# Patient Record
Sex: Male | Born: 1950 | Race: White | Hispanic: No | Smoking: Former smoker
Health system: Southern US, Community
[De-identification: ages and names within clinical notes are randomized; demographics above are authoritative.]

## PROBLEM LIST (undated history)

## (undated) DIAGNOSIS — E119 Type 2 diabetes mellitus without complications: Secondary | ICD-10-CM

## (undated) DIAGNOSIS — J449 Chronic obstructive pulmonary disease, unspecified: Secondary | ICD-10-CM

## (undated) DIAGNOSIS — I10 Essential (primary) hypertension: Secondary | ICD-10-CM

## (undated) HISTORY — PX: LUMBAR DISC SURGERY: SHX700

## (undated) HISTORY — PX: ROTATOR CUFF REPAIR: SHX139

---

## 2016-02-20 ENCOUNTER — Ambulatory Visit: Admission: RE | Admit: 2016-02-20 | Payer: Self-pay | Source: Ambulatory Visit | Admitting: Ophthalmology

## 2016-02-20 ENCOUNTER — Encounter: Admission: RE | Payer: Self-pay | Source: Ambulatory Visit

## 2016-02-20 SURGERY — PHACOEMULSIFICATION, CATARACT, WITH IOL INSERTION
Anesthesia: Choice | Laterality: Left

## 2018-09-22 ENCOUNTER — Other Ambulatory Visit
Admission: RE | Admit: 2018-09-22 | Discharge: 2018-09-22 | Disposition: A | Discharge status: Home / Self Care | Payer: Medicare Other | Source: Ambulatory Visit | Attending: Rheumatology | Admitting: Rheumatology

## 2018-09-22 DIAGNOSIS — M1990 Unspecified osteoarthritis, unspecified site: Secondary | ICD-10-CM | POA: Insufficient documentation

## 2018-09-22 DIAGNOSIS — Z8739 Personal history of other diseases of the musculoskeletal system and connective tissue: Secondary | ICD-10-CM | POA: Insufficient documentation

## 2018-09-22 DIAGNOSIS — M25562 Pain in left knee: Secondary | ICD-10-CM | POA: Diagnosis present

## 2018-09-22 LAB — SYNOVIAL CELL COUNT + DIFF, W/ CRYSTALS
Eosinophils-Synovial: 0 %
Lymphocytes-Synovial Fld: 2 %
Monocyte-Macrophage-Synovial Fluid: 23 %
NEUTROPHIL, SYNOVIAL: 75 %
WBC, SYNOVIAL: 5462 /mm3 — AB (ref 0–200)

## 2019-02-06 ENCOUNTER — Other Ambulatory Visit (HOSPITAL_COMMUNITY): Payer: Self-pay

## 2019-02-13 ENCOUNTER — Ambulatory Visit: Admit: 2019-02-13 | Payer: Self-pay | Admitting: Ophthalmology

## 2019-02-13 SURGERY — PHACOEMULSIFICATION, CATARACT, WITH IOL INSERTION
Anesthesia: Monitor Anesthesia Care | Laterality: Left

## 2019-02-21 ENCOUNTER — Other Ambulatory Visit (HOSPITAL_COMMUNITY): Payer: Self-pay

## 2019-02-27 ENCOUNTER — Ambulatory Visit: Admit: 2019-02-27 | Payer: Self-pay | Admitting: Ophthalmology

## 2019-02-27 SURGERY — PHACOEMULSIFICATION, CATARACT, WITH IOL INSERTION
Anesthesia: Monitor Anesthesia Care | Laterality: Right

## 2019-04-03 ENCOUNTER — Other Ambulatory Visit: Payer: Self-pay

## 2019-04-03 ENCOUNTER — Encounter (HOSPITAL_COMMUNITY)
Admission: RE | Admit: 2019-04-03 | Discharge: 2019-04-03 | Disposition: A | Discharge status: Home / Self Care | Payer: Medicare Other | Source: Ambulatory Visit | Attending: Ophthalmology | Admitting: Ophthalmology

## 2019-04-03 ENCOUNTER — Encounter (HOSPITAL_COMMUNITY): Payer: Self-pay

## 2019-04-03 DIAGNOSIS — Z1159 Encounter for screening for other viral diseases: Secondary | ICD-10-CM | POA: Insufficient documentation

## 2019-04-03 DIAGNOSIS — Z01812 Encounter for preprocedural laboratory examination: Secondary | ICD-10-CM | POA: Diagnosis not present

## 2019-04-03 HISTORY — DX: Type 2 diabetes mellitus without complications: E11.9

## 2019-04-03 HISTORY — DX: Essential (primary) hypertension: I10

## 2019-04-03 HISTORY — DX: Chronic obstructive pulmonary disease, unspecified: J44.9

## 2019-04-03 LAB — BASIC METABOLIC PANEL
Anion gap: 12 (ref 5–15)
BUN: 18 mg/dL (ref 8–23)
CO2: 22 mmol/L (ref 22–32)
Calcium: 9.1 mg/dL (ref 8.9–10.3)
Chloride: 106 mmol/L (ref 98–111)
Creatinine, Ser: 1.24 mg/dL (ref 0.61–1.24)
GFR calc Af Amer: >60 mL/min (ref >60)
GFR calc non Af Amer: >60 mL/min (ref >60)
Glucose, Bld: 105 mg/dL — ABNORMAL HIGH (ref 70–99)
Potassium: 4.2 mmol/L (ref 3.5–5.1)
Sodium: 140 mmol/L (ref 135–145)

## 2019-04-03 LAB — HEMOGLOBIN A1C
Hgb A1c MFr Bld: 5.7 % — ABNORMAL HIGH (ref 4.8–5.6)
Mean Plasma Glucose: 116.89 mg/dL

## 2019-04-03 LAB — GLUCOSE, CAPILLARY: Glucose-Capillary: 98 mg/dL (ref 70–99)

## 2019-04-03 NOTE — Patient Instructions (Signed)
Jonathan Wheeler  04/03/2019     @PREFPERIOPPHARMACY @   Your procedure is scheduled on  04/10/2019.  [facility] at  [facility]   [facility].M.  Call this number if you have problems the morning of surgery:  [phone]   Remember:  Do not eat or drink after midnight.                        Take these medicines the morning of surgery with A SIP OF WATER  Colchicine, gabapentin, tramadol(if needed). Use your inhaler before you come. Take 1/2 of your night time insulin the night before your surgery.    Do not wear jewelry, make-up or nail polish.  Do not wear lotions, powders, or perfumes, or deodorant.  Do not shave 48 hours prior to surgery.  Men may shave face and neck.  Do not bring valuables to the hospital.  [facility] any belongings or valuables.  Contacts, dentures or bridgework may not be worn into surgery.  Leave your suitcase in the car.  After surgery it may be brought to your room.  For patients admitted to the hospital, discharge time will be determined by your treatment team.  Patients discharged the day of surgery will not be allowed to drive home.   Name and phone number of your driver:   family Special instructions:  None  Please read over the following fact sheets that you were given. Anesthesia Post-op Instructions and Care and Recovery After Surgery      Cataract Surgery, Care After This sheet gives you information about how to care for yourself after your procedure. Your health care provider may also give you more specific instructions. If you have problems or questions, contact your health care provider. What can I expect after the procedure? After the procedure, it is common to have:  Itching.  Discomfort.  Fluid discharge.  Sensitivity to light and to touch.  Bruising in or around the eye.  Mild blurred vision. Follow these instructions at home: Eye care   Do not touch or rub your eyes.  Protect your eyes as told by  your health care provider. You may be told to wear a protective eye shield or sunglasses.  Do not put a contact lens into the affected eye or eyes until your health care provider approves.  Keep the area around your eye clean and dry: ? Avoid swimming. ? Do not allow water to hit you directly in the face while showering. ? Keep soap and shampoo out of your eyes.  Check your eye every day for signs of infection. Watch for: ? Redness, swelling, or pain. ? Fluid, blood, or pus. ? Warmth. ? A bad smell. ? Vision that is getting worse. ? Sensitivity that is getting worse. Activity  Do not drive for 24 hours if you were given a sedative during your procedure.  Avoid strenuous activities, such as playing contact sports, for as long as told by your health care provider.  Do not drive or use heavy machinery until your health care provider approves.  Do not bend or lift heavy objects. Bending increases pressure in the eye. You can walk, climb stairs, and do light household chores.  Ask your health care provider when you can return to work. If you work in a dusty environment, you may be advised to wear protective eyewear for a period of time. General instructions  Take or apply over-the-counter  and prescription medicines only as told by your health care provider. This includes eye drops.  Keep all follow-up visits as told by your health care provider. This is important. Contact a health care provider if:  You have increased bruising around your eye.  You have pain that is not helped with medicine.  You have a fever.  You have redness, swelling, or pain in your eye.  You have fluid, blood, or pus coming from your incision.  Your vision gets worse.  Your sensitivity to light gets worse. Get help right away if:  You have sudden loss of vision.  You see flashes of light or spots (floaters).  You have severe eye pain.  You develop nausea or vomiting. Summary  After your  procedure, it is common to have itching, discomfort, bruising, fluid discharge, or sensitivity to light.  Follow instructions from your health care provider about caring for your eye after the procedure.  Do not rub your eye after the procedure. You may need to wear eye protection or sunglasses. Do not wear contact lenses. Keep the area around your eye clean and dry.  Avoid activities that require a lot of effort. These include playing sports and lifting heavy objects.  Contact a health care provider if you have increased bruising, pain that does not go away, or a fever. Get help right away if you suddenly lose your vision, see flashes of light or spots, or have severe pain in the eye. This information is not intended to replace advice given to you by your health care provider. Make sure you discuss any questions you have with your health care provider. Document Released: 05/08/2005 Document Revised: 04/18/2018 Document Reviewed: 04/18/2018 Elsevier Interactive Patient Education  2019 [employer].

## 2019-04-05 LAB — NOVEL CORONAVIRUS, NAA (HOSP ORDER, SEND-OUT TO REF LAB; TAT 18-24 HRS)

## 2019-04-06 ENCOUNTER — Other Ambulatory Visit (HOSPITAL_COMMUNITY)
Admission: RE | Admit: 2019-04-06 | Discharge: 2019-04-06 | Disposition: A | Discharge status: Home / Self Care | Payer: Medicare Other | Source: Ambulatory Visit | Attending: Ophthalmology | Admitting: Ophthalmology

## 2019-04-06 ENCOUNTER — Other Ambulatory Visit: Payer: Self-pay

## 2019-04-06 DIAGNOSIS — Z01812 Encounter for preprocedural laboratory examination: Secondary | ICD-10-CM | POA: Diagnosis not present

## 2019-04-07 LAB — NOVEL CORONAVIRUS, NAA (HOSP ORDER, SEND-OUT TO REF LAB; TAT 18-24 HRS): SARS-CoV-2, NAA: NOT DETECTED

## 2019-04-10 ENCOUNTER — Ambulatory Visit (HOSPITAL_COMMUNITY)
Admission: RE | Admit: 2019-04-10 | Discharge: 2019-04-10 | Disposition: A | Discharge status: Home / Self Care | Payer: Medicare Other | Attending: Ophthalmology | Admitting: Ophthalmology

## 2019-04-10 ENCOUNTER — Ambulatory Visit (HOSPITAL_COMMUNITY): Payer: Medicare Other | Admitting: Anesthesiology

## 2019-04-10 ENCOUNTER — Other Ambulatory Visit: Payer: Self-pay

## 2019-04-10 ENCOUNTER — Encounter (HOSPITAL_COMMUNITY): Payer: Self-pay | Admitting: *Deleted

## 2019-04-10 ENCOUNTER — Encounter (HOSPITAL_COMMUNITY)
Admission: RE | Disposition: A | Discharge status: Home / Self Care | Payer: Self-pay | Source: Home / Self Care | Attending: Ophthalmology

## 2019-04-10 DIAGNOSIS — E1136 Type 2 diabetes mellitus with diabetic cataract: Secondary | ICD-10-CM | POA: Insufficient documentation

## 2019-04-10 DIAGNOSIS — J449 Chronic obstructive pulmonary disease, unspecified: Secondary | ICD-10-CM | POA: Insufficient documentation

## 2019-04-10 DIAGNOSIS — Z794 Long term (current) use of insulin: Secondary | ICD-10-CM | POA: Diagnosis not present

## 2019-04-10 DIAGNOSIS — E1122 Type 2 diabetes mellitus with diabetic chronic kidney disease: Secondary | ICD-10-CM | POA: Diagnosis not present

## 2019-04-10 DIAGNOSIS — M1990 Unspecified osteoarthritis, unspecified site: Secondary | ICD-10-CM | POA: Diagnosis not present

## 2019-04-10 DIAGNOSIS — I129 Hypertensive chronic kidney disease with stage 1 through stage 4 chronic kidney disease, or unspecified chronic kidney disease: Secondary | ICD-10-CM | POA: Insufficient documentation

## 2019-04-10 DIAGNOSIS — N189 Chronic kidney disease, unspecified: Secondary | ICD-10-CM | POA: Diagnosis not present

## 2019-04-10 DIAGNOSIS — H259 Unspecified age-related cataract: Secondary | ICD-10-CM | POA: Diagnosis not present

## 2019-04-10 DIAGNOSIS — Z87891 Personal history of nicotine dependence: Secondary | ICD-10-CM | POA: Diagnosis not present

## 2019-04-10 DIAGNOSIS — Z7982 Long term (current) use of aspirin: Secondary | ICD-10-CM | POA: Diagnosis not present

## 2019-04-10 DIAGNOSIS — Z79899 Other long term (current) drug therapy: Secondary | ICD-10-CM | POA: Diagnosis not present

## 2019-04-10 HISTORY — PX: CATARACT EXTRACTION W/PHACO: SHX586

## 2019-04-10 LAB — GLUCOSE, CAPILLARY: Glucose-Capillary: 99 mg/dL (ref 70–99)

## 2019-04-10 SURGERY — PHACOEMULSIFICATION, CATARACT, WITH IOL INSERTION
Anesthesia: Monitor Anesthesia Care | Site: Eye | Laterality: Left

## 2019-04-10 MED ORDER — PROVISC 10 MG/ML IO SOLN
INTRAOCULAR | Status: DC | PRN
  Administered 2019-04-10: 0.85 mL via INTRAOCULAR

## 2019-04-10 MED ORDER — SODIUM HYALURONATE 23 MG/ML IO SOLN
INTRAOCULAR | Status: DC | PRN
  Administered 2019-04-10: 0.6 mL via INTRAOCULAR

## 2019-04-10 MED ORDER — CYCLOPENTOLATE-PHENYLEPHRINE 0.2-1 % OP SOLN
1.0000 [drp] | OPHTHALMIC | Status: AC
  Administered 2019-04-10 (×3): 1 [drp] via OPHTHALMIC

## 2019-04-10 MED ORDER — TETRACAINE HCL 0.5 % OP SOLN
1.0000 [drp] | OPHTHALMIC | Status: AC
  Administered 2019-04-10 (×3): 1 [drp] via OPHTHALMIC

## 2019-04-10 MED ORDER — BSS IO SOLN
INTRAOCULAR | Status: DC | PRN
  Administered 2019-04-10: 15 mL

## 2019-04-10 MED ORDER — LACTATED RINGERS IV SOLN: INTRAVENOUS | Status: DC

## 2019-04-10 MED ORDER — POVIDONE-IODINE 5 % OP SOLN
OPHTHALMIC | Status: DC | PRN
  Administered 2019-04-10: 1 via OPHTHALMIC

## 2019-04-10 MED ORDER — PHENYLEPHRINE HCL 2.5 % OP SOLN
1.0000 [drp] | OPHTHALMIC | Status: AC
  Administered 2019-04-10 (×3): 1 [drp] via OPHTHALMIC

## 2019-04-10 MED ORDER — LIDOCAINE HCL 3.5 % OP GEL
1.0000 "application " | Freq: Once | OPHTHALMIC | Status: AC
  Administered 2019-04-10: 1 via OPHTHALMIC

## 2019-04-10 MED ORDER — LIDOCAINE HCL (PF) 1 % IJ SOLN
INTRAOCULAR | Status: DC | PRN
  Administered 2019-04-10: 1 mL via OPHTHALMIC

## 2019-04-10 MED ORDER — NEOMYCIN-POLYMYXIN-DEXAMETH 3.5-10000-0.1 OP SUSP
OPHTHALMIC | Status: DC | PRN
  Administered 2019-04-10: 2 [drp] via OPHTHALMIC

## 2019-04-10 MED ORDER — EPINEPHRINE PF 1 MG/ML IJ SOLN
INTRAOCULAR | Status: DC | PRN
  Administered 2019-04-10: 500 mL

## 2019-04-10 SURGICAL SUPPLY — 14 items
CLOTH BEACON ORANGE TIMEOUT ST (SAFETY) ×3 IMPLANT
GLOVE BIOGEL PI IND STRL 6.5 (GLOVE) ×1 IMPLANT
GLOVE BIOGEL PI IND STRL 7.0 (GLOVE) ×1 IMPLANT
GLOVE BIOGEL PI INDICATOR 6.5 (GLOVE) ×2
GLOVE BIOGEL PI INDICATOR 7.0 (GLOVE) ×2
LENS ALC ACRYL/TECN (Ophthalmic Related) ×3 IMPLANT
NEEDLE HYPO 18GX1.5 BLUNT FILL (NEEDLE) ×3 IMPLANT
PAD ARMBOARD 7.5X6 YLW CONV (MISCELLANEOUS) ×3 IMPLANT
SHIELD EYE LENSE ONLY DISP (GAUZE/BANDAGES/DRESSINGS) ×3 IMPLANT
SYR TB 1ML LL NO SAFETY (SYRINGE) ×3 IMPLANT
TAPE SURG TRANSPORE 1 IN (GAUZE/BANDAGES/DRESSINGS) ×1 IMPLANT
TAPE SURGICAL TRANSPORE 1 IN (GAUZE/BANDAGES/DRESSINGS) ×2
VISCOELASTIC ADDITIONAL (OPHTHALMIC RELATED) ×3 IMPLANT
WATER STERILE IRR 250ML POUR (IV SOLUTION) ×3 IMPLANT

## 2019-04-10 NOTE — H&P (Signed)
The H and P was reviewed and updated. The patient was examined.  No changes were found after exam.  The surgical eye was marked.  

## 2019-04-10 NOTE — Anesthesia Postprocedure Evaluation (Signed)
Anesthesia Post Note  Patient: Jonathan Wheeler  Procedure(s) Performed: CATARACT EXTRACTION PHACO AND INTRAOCULAR LENS PLACEMENT ([place]) (Left Eye)  Patient location during evaluation: PACU Anesthesia Type: MAC Level of consciousness: awake and alert and oriented Pain management: pain level controlled Vital Signs Assessment: post-procedure vital signs reviewed and stable Respiratory status: spontaneous breathing Cardiovascular status: blood pressure returned to baseline and stable Postop Assessment: no apparent nausea or vomiting Anesthetic complications: no     Last Vitals:  Vitals:   04/10/19 0846  BP: 128/70  Resp: 20  Temp: 36.7 C  SpO2: 94%    Last Pain:  Vitals:   04/10/19 0846  TempSrc: Oral  PainSc: 0-No pain                 Kenzee Bassin

## 2019-04-10 NOTE — Discharge Instructions (Addendum)
Please discharge patient when stable, will follow up today with Dr. Wrzosek at the [facility] office immediately following discharge.  Leave shield in place until visit.  All paperwork with discharge instructions will be given at the office. ° °

## 2019-04-10 NOTE — Transfer of Care (Signed)
Immediate Anesthesia Transfer of Care Note  Patient: Jonathan Wheeler  Procedure(s) Performed: CATARACT EXTRACTION PHACO AND INTRAOCULAR LENS PLACEMENT ([facility]) (Left Eye)  Patient Location: PACU  Anesthesia Type:MAC  Level of Consciousness: awake, alert  and oriented  Airway & Oxygen Therapy: Patient Spontanous Breathing  Post-op Assessment: Post -op Vital signs reviewed and stable  Post vital signs: Reviewed and stable  Last Vitals:  Vitals Value Taken Time  BP    Temp    Pulse    Resp    SpO2      Last Pain:  Vitals:   04/10/19 0846  TempSrc: Oral  PainSc: 0-No pain      Patients Stated Pain Goal: 8 (85/88/50 2774)  Complications: No apparent anesthesia complications

## 2019-04-10 NOTE — Op Note (Signed)
Date of procedure: 04/10/19  Pre-operative diagnosis: Visually significant age-related cataract, Left Eye (H25.812)  Post-operative diagnosis: Visually significant age-related cataract, Left Eye  Procedure: Removal of cataract via phacoemulsification and insertion of intra-ocular lens [employer]  +23.0D into the capsular bag of the Left Eye  Attending surgeon: Gerda Diss. Laray Rivkin, MD, MA  Anesthesia: MAC, Topical Akten  Complications: None  Estimated Blood Loss: <25m (minimal)  Specimens: None  Implants: As above  Indications:  Visually significant age-related cataract, Left Eye  Procedure:  The patient was seen and identified in the pre-operative area. The operative eye was identified and dilated.  The operative eye was marked.  Topical anesthesia was administered to the operative eye.     The patient was then to the operative suite and placed in the supine position.  A timeout was performed confirming the patient, procedure to be performed, and all other relevant information.   The patient's face was prepped and draped in the usual fashion for intra-ocular surgery.  A lid speculum was placed into the operative eye and the surgical microscope moved into place and focused.  An inferotemporal paracentesis was created using a 20 gauge paracentesis blade.  Shugarcaine was injected into the anterior chamber.  Viscoelastic was injected into the anterior chamber.  A temporal clear-corneal main wound incision was created using a 2.471mmicrokeratome.  A continuous curvilinear capsulorrhexis was initiated using an irrigating cystitome and completed using capsulorrhexis forceps.  Hydrodissection and hydrodeliniation were performed.  Viscoelastic was injected into the anterior chamber.  A phacoemulsification handpiece and a chopper as a second instrument were used to remove the nucleus and epinucleus. The irrigation/aspiration handpiece was used to remove any remaining cortical  material.   The capsular bag was reinflated with viscoelastic, checked, and found to be intact.  The intraocular lens was inserted into the capsular bag and dialed into place using a Kuglen hook.  The irrigation/aspiration handpiece was used to remove any remaining viscoelastic.  The clear corneal wound and paracentesis wounds were then hydrated and checked with Weck-Cels to be watertight.  The lid-speculum and drape was removed, and the patient's face was cleaned with a wet and dry 4x4.  Maxitrol was instilled in the eye before a clear shield was taped over the eye. The patient was taken to the post-operative care unit in good condition, having tolerated the procedure well.  Post-Op Instructions: [facility] and will receive all other orders and instructions.

## 2019-04-10 NOTE — Anesthesia Preprocedure Evaluation (Signed)
Anesthesia Evaluation  Patient identified by MRN, date of birth, ID band Patient awake    Reviewed: Allergy & Precautions, NPO status , Patient's Chart, lab work & pertinent test results, reviewed documented beta blocker date and time   Airway Mallampati: I  TM Distance: >3 FB Neck ROM: Full    Dental no notable dental hx. (+) Missing, Poor Dentition, Chipped   Pulmonary COPD,  COPD inhaler, former smoker,  COPD denies OSA but never formally tested -BMI>40   Pulmonary exam normal breath sounds clear to auscultation       Cardiovascular Exercise Tolerance: Poor hypertension, Pt. on medications and Pt. on home beta blockers negative cardio ROS Normal cardiovascular exam Rhythm:Regular Rate:Normal  States ET limited by LBP/RLE issues  Denies CP/DOE   Neuro/Psych negative neurological ROS  negative psych ROS   GI/Hepatic negative GI ROS, Neg liver ROS,   Endo/Other  negative endocrine ROSdiabetes  Renal/GU negative Renal ROS  negative genitourinary   Musculoskeletal negative musculoskeletal ROS (+)   Abdominal   Peds negative pediatric ROS (+)  Hematology negative hematology ROS (+)   Anesthesia Other Findings   Reproductive/Obstetrics negative OB ROS                             Anesthesia Physical Anesthesia Plan  ASA: III  Anesthesia Plan: MAC   Post-op Pain Management:    Induction: Intravenous  PONV Risk Score and Plan:   Airway Management Planned: Nasal Cannula and Simple Face Mask  Additional Equipment:   Intra-op Plan:   Post-operative Plan:   Informed Consent: I have reviewed the patients History and Physical, chart, labs and discussed the procedure including the risks, benefits and alternatives for the proposed anesthesia with the patient or authorized representative who has indicated his/her understanding and acceptance.     Dental advisory given  Plan  Discussed with: CRNA  Anesthesia Plan Comments: (Plan Full PPE use  Plan MAC with GA back up -WTP)        Anesthesia Quick Evaluation

## 2019-04-11 ENCOUNTER — Encounter (HOSPITAL_COMMUNITY): Payer: Self-pay | Admitting: Ophthalmology

## 2019-04-19 ENCOUNTER — Encounter (HOSPITAL_COMMUNITY): Payer: Self-pay

## 2019-04-19 ENCOUNTER — Encounter (HOSPITAL_COMMUNITY)
Admission: RE | Admit: 2019-04-19 | Discharge: 2019-04-19 | Disposition: A | Discharge status: Home / Self Care | Payer: Medicare Other | Source: Ambulatory Visit | Attending: Ophthalmology | Admitting: Ophthalmology

## 2019-04-19 ENCOUNTER — Other Ambulatory Visit: Payer: Self-pay

## 2019-04-19 MED ORDER — HYDROCODONE-ACETAMINOPHEN 7.5-325 MG PO TABS: 1.0000 | ORAL_TABLET | Freq: Once | ORAL | Status: DC | PRN

## 2019-04-19 MED ORDER — PROMETHAZINE HCL 25 MG/ML IJ SOLN: 6.2500 mg | INTRAMUSCULAR | Status: DC | PRN

## 2019-04-19 MED ORDER — MIDAZOLAM HCL 2 MG/2ML IJ SOLN: 0.5000 mg | Freq: Once | INTRAMUSCULAR | Status: DC | PRN

## 2019-04-19 MED ORDER — HYDROMORPHONE HCL 1 MG/ML IJ SOLN: 0.2500 mg | INTRAMUSCULAR | Status: DC | PRN

## 2019-04-20 ENCOUNTER — Other Ambulatory Visit (HOSPITAL_COMMUNITY)
Admission: RE | Admit: 2019-04-20 | Discharge: 2019-04-20 | Disposition: A | Discharge status: Home / Self Care | Payer: Medicare Other | Source: Ambulatory Visit | Attending: Ophthalmology | Admitting: Ophthalmology

## 2019-04-20 DIAGNOSIS — Z1159 Encounter for screening for other viral diseases: Secondary | ICD-10-CM | POA: Insufficient documentation

## 2019-04-21 LAB — NOVEL CORONAVIRUS, NAA (HOSP ORDER, SEND-OUT TO REF LAB; TAT 18-24 HRS): SARS-CoV-2, NAA: NOT DETECTED

## 2019-04-24 ENCOUNTER — Ambulatory Visit (HOSPITAL_COMMUNITY): Payer: Medicare Other | Admitting: Anesthesiology

## 2019-04-24 ENCOUNTER — Encounter (HOSPITAL_COMMUNITY)
Admission: RE | Disposition: A | Discharge status: Home / Self Care | Payer: Self-pay | Source: Home / Self Care | Attending: Ophthalmology

## 2019-04-24 ENCOUNTER — Ambulatory Visit (HOSPITAL_COMMUNITY)
Admission: RE | Admit: 2019-04-24 | Discharge: 2019-04-24 | Disposition: A | Discharge status: Home / Self Care | Payer: Medicare Other | Attending: Ophthalmology | Admitting: Ophthalmology

## 2019-04-24 ENCOUNTER — Encounter (HOSPITAL_COMMUNITY): Payer: Self-pay | Admitting: *Deleted

## 2019-04-24 ENCOUNTER — Other Ambulatory Visit: Payer: Self-pay

## 2019-04-24 DIAGNOSIS — M1990 Unspecified osteoarthritis, unspecified site: Secondary | ICD-10-CM | POA: Diagnosis not present

## 2019-04-24 DIAGNOSIS — Z7951 Long term (current) use of inhaled steroids: Secondary | ICD-10-CM | POA: Diagnosis not present

## 2019-04-24 DIAGNOSIS — Z87891 Personal history of nicotine dependence: Secondary | ICD-10-CM | POA: Insufficient documentation

## 2019-04-24 DIAGNOSIS — H259 Unspecified age-related cataract: Secondary | ICD-10-CM | POA: Diagnosis not present

## 2019-04-24 DIAGNOSIS — E1136 Type 2 diabetes mellitus with diabetic cataract: Secondary | ICD-10-CM | POA: Insufficient documentation

## 2019-04-24 DIAGNOSIS — J449 Chronic obstructive pulmonary disease, unspecified: Secondary | ICD-10-CM | POA: Insufficient documentation

## 2019-04-24 DIAGNOSIS — E7800 Pure hypercholesterolemia, unspecified: Secondary | ICD-10-CM | POA: Diagnosis not present

## 2019-04-24 DIAGNOSIS — Z7982 Long term (current) use of aspirin: Secondary | ICD-10-CM | POA: Insufficient documentation

## 2019-04-24 DIAGNOSIS — E1122 Type 2 diabetes mellitus with diabetic chronic kidney disease: Secondary | ICD-10-CM | POA: Insufficient documentation

## 2019-04-24 DIAGNOSIS — Z9842 Cataract extraction status, left eye: Secondary | ICD-10-CM | POA: Diagnosis not present

## 2019-04-24 DIAGNOSIS — Z79899 Other long term (current) drug therapy: Secondary | ICD-10-CM | POA: Diagnosis not present

## 2019-04-24 DIAGNOSIS — I129 Hypertensive chronic kidney disease with stage 1 through stage 4 chronic kidney disease, or unspecified chronic kidney disease: Secondary | ICD-10-CM | POA: Diagnosis not present

## 2019-04-24 DIAGNOSIS — N189 Chronic kidney disease, unspecified: Secondary | ICD-10-CM | POA: Insufficient documentation

## 2019-04-24 HISTORY — PX: CATARACT EXTRACTION W/PHACO: SHX586

## 2019-04-24 LAB — GLUCOSE, CAPILLARY: Glucose-Capillary: 221 mg/dL — ABNORMAL HIGH (ref 70–99)

## 2019-04-24 SURGERY — PHACOEMULSIFICATION, CATARACT, WITH IOL INSERTION
Anesthesia: Monitor Anesthesia Care | Site: Eye | Laterality: Right

## 2019-04-24 MED ORDER — EPINEPHRINE PF 1 MG/ML IJ SOLN
INTRAMUSCULAR | Status: AC
  Filled 2019-04-24: qty 2

## 2019-04-24 MED ORDER — POVIDONE-IODINE 5 % OP SOLN
OPHTHALMIC | Status: DC | PRN
  Administered 2019-04-24: 1 via OPHTHALMIC

## 2019-04-24 MED ORDER — LIDOCAINE HCL (PF) 1 % IJ SOLN
INTRAOCULAR | Status: DC | PRN
  Administered 2019-04-24: 1 mL via OPHTHALMIC

## 2019-04-24 MED ORDER — TETRACAINE HCL 0.5 % OP SOLN
1.0000 [drp] | OPHTHALMIC | Status: AC
  Administered 2019-04-24 (×3): 1 [drp] via OPHTHALMIC

## 2019-04-24 MED ORDER — NEOMYCIN-POLYMYXIN-DEXAMETH 3.5-10000-0.1 OP SUSP
OPHTHALMIC | Status: DC | PRN
  Administered 2019-04-24: 2 [drp] via OPHTHALMIC

## 2019-04-24 MED ORDER — SODIUM HYALURONATE 23 MG/ML IO SOLN
INTRAOCULAR | Status: DC | PRN
  Administered 2019-04-24: 0.6 mL via INTRAOCULAR

## 2019-04-24 MED ORDER — CYCLOPENTOLATE-PHENYLEPHRINE 0.2-1 % OP SOLN
1.0000 [drp] | OPHTHALMIC | Status: AC
  Administered 2019-04-24 (×3): 1 [drp] via OPHTHALMIC

## 2019-04-24 MED ORDER — LIDOCAINE HCL 3.5 % OP GEL
1.0000 "application " | Freq: Once | OPHTHALMIC | Status: AC
  Administered 2019-04-24: 1 via OPHTHALMIC

## 2019-04-24 MED ORDER — PROVISC 10 MG/ML IO SOLN
INTRAOCULAR | Status: DC | PRN
  Administered 2019-04-24: 0.85 mL via INTRAOCULAR

## 2019-04-24 MED ORDER — PHENYLEPHRINE HCL 2.5 % OP SOLN
1.0000 [drp] | OPHTHALMIC | Status: AC
  Administered 2019-04-24 (×3): 1 [drp] via OPHTHALMIC

## 2019-04-24 MED ORDER — EPINEPHRINE PF 1 MG/ML IJ SOLN
INTRAOCULAR | Status: DC | PRN
  Administered 2019-04-24: 09:00:00 500 mL

## 2019-04-24 MED ORDER — BSS IO SOLN
INTRAOCULAR | Status: DC | PRN
  Administered 2019-04-24: 15 mL via INTRAOCULAR

## 2019-04-24 SURGICAL SUPPLY — 13 items

## 2019-04-24 NOTE — H&P (Signed)
The H and P was reviewed and updated. The patient was examined.  No changes were found after exam.  The surgical eye was marked.  

## 2019-04-24 NOTE — Op Note (Signed)
Date of procedure: 04/24/19  Pre-operative diagnosis: Visually significant age-related cataract, Right Eye (H25.811)  Post-operative diagnosis: Visually significant age-related cataract, Right Eye  Procedure: Removal of cataract via phacoemulsification and insertion of intra-ocular lens [employer] PCB00  +24.0D into the capsular bag of the Right Eye  Attending surgeon: Gerda Diss. Braniya Farrugia, MD, MA  Anesthesia: MAC, Topical Akten  Complications: None  Estimated Blood Loss: <85m (minimal)  Specimens: None  Implants: As above  Indications:  Visually significant age-related cataract, Right Eye  Procedure:  The patient was seen and identified in the pre-operative area. The operative eye was identified and dilated.  The operative eye was marked.  Topical anesthesia was administered to the operative eye.     The patient was then to the operative suite and placed in the supine position.  A timeout was performed confirming the patient, procedure to be performed, and all other relevant information.   The patient's face was prepped and draped in the usual fashion for intra-ocular surgery.  A lid speculum was placed into the operative eye and the surgical microscope moved into place and focused.  A superotemporal paracentesis was created using a 20 gauge paracentesis blade.  Shugarcaine was injected into the anterior chamber.  Viscoelastic was injected into the anterior chamber.  A temporal clear-corneal main wound incision was created using a 2.449mmicrokeratome.  A continuous curvilinear capsulorrhexis was initiated using an irrigating cystitome and completed using capsulorrhexis forceps.  Hydrodissection and hydrodeliniation were performed.  Viscoelastic was injected into the anterior chamber.  A phacoemulsification handpiece and a chopper as a second instrument were used to remove the nucleus and epinucleus. The irrigation/aspiration handpiece was used to remove any remaining cortical  material.   The capsular bag was reinflated with viscoelastic, checked, and found to be intact.  The intraocular lens was inserted into the capsular bag and dialed into place using a Kuglen hook.  The irrigation/aspiration handpiece was used to remove any remaining viscoelastic.  The clear corneal wound and paracentesis wounds were then hydrated and checked with Weck-Cels to be watertight.  The lid-speculum and drape was removed, and the patient's face was cleaned with a wet and dry 4x4.  Maxitrol was instilled in the eye before a clear shield was taped over the eye. The patient was taken to the post-operative care unit in good condition, having tolerated the procedure well.  Post-Op Instructions: [facility] and will receive all other orders and instructions.

## 2019-04-24 NOTE — Anesthesia Procedure Notes (Signed)
Procedure Name: MAC Date/Time: 04/24/2019 8:27 AM Performed by: Andree Elk Amy A, CRNA Pre-anesthesia Checklist: Patient identified, Emergency Drugs available, Suction available, Timeout performed and Patient being monitored Patient Re-evaluated:Patient Re-evaluated prior to induction Oxygen Delivery Method: Nasal Cannula

## 2019-04-24 NOTE — Discharge Instructions (Addendum)
PATIENT INSTRUCTIONS °POST-ANESTHESIA ° °IMMEDIATELY FOLLOWING SURGERY:  Do not drive or operate machinery for the first twenty four hours after surgery.  Do not make any important decisions for twenty four hours after surgery or while taking narcotic pain medications or sedatives.  If you develop intractable nausea and vomiting or a severe headache please notify your doctor immediately. ° °FOLLOW-UP:  Please make an appointment with your surgeon as instructed. You do not need to follow up with anesthesia unless specifically instructed to do so. ° °WOUND CARE INSTRUCTIONS (if applicable):  Keep a dry clean dressing on the anesthesia/puncture wound site if there is drainage.  Once the wound has quit draining you may leave it open to air.  Generally you should leave the bandage intact for twenty four hours unless there is drainage.  If the epidural site drains for more than 36-48 hours please call the anesthesia department. ° °QUESTIONS?:  Please feel free to call your physician or the hospital operator if you have any questions, and they will be happy to assist you.    ° ° °Please discharge patient when stable, will follow up today with Dr. Wrzosek at the [facility] office immediately following discharge.  Leave shield in place until visit.  All paperwork with discharge instructions will be given at the office. ° °

## 2019-04-24 NOTE — Transfer of Care (Signed)
Immediate Anesthesia Transfer of Care Note  Patient: Jonathan Wheeler  Procedure(s) Performed: CATARACT EXTRACTION PHACO AND INTRAOCULAR LENS PLACEMENT RIGHT EYE (Right Eye)  Patient Location: Short Stay  Anesthesia Type:MAC  Level of Consciousness: awake, alert , oriented and patient cooperative  Airway & Oxygen Therapy: Patient Spontanous Breathing  Post-op Assessment: Report given to RN and Post -op Vital signs reviewed and stable  Post vital signs: Reviewed and stable  Last Vitals:  Vitals Value Taken Time  BP    Temp    Pulse    Resp    SpO2      Last Pain:  Vitals:   04/24/19 0719  TempSrc: Oral  PainSc: 0-No pain         Complications: No apparent anesthesia complications

## 2019-04-24 NOTE — Anesthesia Postprocedure Evaluation (Signed)
Anesthesia Post Note  Patient: Jonathan Wheeler  Procedure(s) Performed: CATARACT EXTRACTION PHACO AND INTRAOCULAR LENS PLACEMENT RIGHT EYE (Right Eye)  Patient location during evaluation: Short Stay Anesthesia Type: MAC Level of consciousness: awake and alert and oriented Pain management: pain level controlled Vital Signs Assessment: post-procedure vital signs reviewed and stable Respiratory status: spontaneous breathing Cardiovascular status: stable Postop Assessment: no apparent nausea or vomiting Anesthetic complications: no     Last Vitals:  Vitals:   04/24/19 0719  BP: 135/69  Pulse: 90  Resp: 20  Temp: 37.1 C  SpO2: 94%    Last Pain:  Vitals:   04/24/19 0719  TempSrc: Oral  PainSc: 0-No pain                 Corra Kaine A

## 2019-04-24 NOTE — Anesthesia Preprocedure Evaluation (Signed)
Anesthesia Evaluation    Airway Mallampati: II       Dental  (+) Teeth Intact   Pulmonary COPD, former smoker,    breath sounds clear to auscultation       Cardiovascular hypertension,  Rhythm:regular     Neuro/Psych    GI/Hepatic   Endo/Other  diabetes, Type 2  Renal/GU      Musculoskeletal   Abdominal   Peds  Hematology   Anesthesia Other Findings FBS 221 Reports NPO since MN S/P L Phaco/IOL two weeks ago.no interval changes No issues- requests same  Reproductive/Obstetrics                             Anesthesia Physical Anesthesia Plan  ASA: III  Anesthesia Plan: MAC   Post-op Pain Management:    Induction:   PONV Risk Score and Plan:   Airway Management Planned:   Additional Equipment:   Intra-op Plan:   Post-operative Plan:   Informed Consent: I have reviewed the patients History and Physical, chart, labs and discussed the procedure including the risks, benefits and alternatives for the proposed anesthesia with the patient or authorized representative who has indicated his/her understanding and acceptance.       Plan Discussed with: Anesthesiologist  Anesthesia Plan Comments:         Anesthesia Quick Evaluation

## 2019-04-25 ENCOUNTER — Encounter (HOSPITAL_COMMUNITY): Payer: Self-pay | Admitting: Ophthalmology

## 2019-09-18 ENCOUNTER — Emergency Department: Payer: Medicare Other

## 2019-09-18 ENCOUNTER — Emergency Department
Admission: EM | Admit: 2019-09-18 | Discharge: 2019-09-18 | Disposition: A | Discharge status: Home / Self Care | Payer: Medicare Other | Attending: Emergency Medicine | Admitting: Emergency Medicine

## 2019-09-18 ENCOUNTER — Other Ambulatory Visit: Payer: Self-pay

## 2019-09-18 DIAGNOSIS — E119 Type 2 diabetes mellitus without complications: Secondary | ICD-10-CM | POA: Insufficient documentation

## 2019-09-18 DIAGNOSIS — Z794 Long term (current) use of insulin: Secondary | ICD-10-CM | POA: Diagnosis not present

## 2019-09-18 DIAGNOSIS — W19XXXA Unspecified fall, initial encounter: Secondary | ICD-10-CM

## 2019-09-18 DIAGNOSIS — Z79899 Other long term (current) drug therapy: Secondary | ICD-10-CM | POA: Diagnosis not present

## 2019-09-18 DIAGNOSIS — W010XXA Fall on same level from slipping, tripping and stumbling without subsequent striking against object, initial encounter: Secondary | ICD-10-CM | POA: Diagnosis not present

## 2019-09-18 DIAGNOSIS — M25552 Pain in left hip: Secondary | ICD-10-CM | POA: Insufficient documentation

## 2019-09-18 DIAGNOSIS — Z87891 Personal history of nicotine dependence: Secondary | ICD-10-CM | POA: Insufficient documentation

## 2019-09-18 DIAGNOSIS — J449 Chronic obstructive pulmonary disease, unspecified: Secondary | ICD-10-CM | POA: Insufficient documentation

## 2019-09-18 DIAGNOSIS — M25511 Pain in right shoulder: Secondary | ICD-10-CM | POA: Diagnosis not present

## 2019-09-18 DIAGNOSIS — I10 Essential (primary) hypertension: Secondary | ICD-10-CM | POA: Insufficient documentation

## 2019-09-18 MED ORDER — LIDOCAINE 5 % EX PTCH: 1.0000 | MEDICATED_PATCH | CUTANEOUS | 0 refills | Status: AC

## 2019-09-18 MED ORDER — BACLOFEN 10 MG PO TABS
5.0000 mg | ORAL_TABLET | Freq: Once | ORAL | Status: AC
  Administered 2019-09-18: 14:00:00 5 mg via ORAL
  Filled 2019-09-18: qty 0.5

## 2019-09-18 MED ORDER — LIDOCAINE 5 % EX PTCH
1.0000 | MEDICATED_PATCH | CUTANEOUS | Status: DC
  Administered 2019-09-18: 14:00:00 1 via TRANSDERMAL
  Filled 2019-09-18: qty 1

## 2019-09-18 MED ORDER — OXYCODONE-ACETAMINOPHEN 5-325 MG PO TABS
1.0000 | ORAL_TABLET | Freq: Once | ORAL | Status: AC
  Administered 2019-09-18: 1 via ORAL
  Filled 2019-09-18: qty 1

## 2019-09-18 MED ORDER — METHOCARBAMOL 500 MG PO TABS: 500.0000 mg | ORAL_TABLET | Freq: Three times a day (TID) | ORAL | 0 refills | Status: AC

## 2019-09-18 MED ORDER — BACLOFEN 10 MG PO TABS
5.0000 mg | ORAL_TABLET | Freq: Three times a day (TID) | ORAL | Status: DC
  Filled 2019-09-18 (×2): qty 0.5

## 2019-09-18 NOTE — ED Notes (Signed)
See triage note  Presents with left hip and right shoulder pain s/p fall this am   No deformity noted  Good pulses   Having increased pain with standing

## 2019-09-18 NOTE — ED Notes (Signed)
Patient walking with walker at this time to bathroom across hall

## 2019-09-18 NOTE — ED Provider Notes (Signed)
[facility] Emergency Department Provider Note  ____________________________________________  Time seen: Approximately 1:18 PM  I have reviewed the triage vital signs and the nursing notes.   HISTORY  Chief Complaint Fall    HPI Jonathan Wheeler is a 68 y.o. male that presents to the emergency department for evaluation of right shoulder pain and left hip pain after a fall this morning.  Patient was chasing his [place], who was chasing a cat when he fell.  He hit his right shoulder on a beam and left hip on the stairs.  He did not hit his head or lose consciousness.  He has been able to walk but with pain.  He has a cane at home.  He has chronic back pain and does not feel that he hurt his back.  No headache, neck pain, shortness of breath, chest pain, abdominal pain.  Past Medical History:  Diagnosis Date  . COPD (chronic obstructive pulmonary disease) (HCC)   . Diabetes mellitus without complication (HCC)   . Hypertension     There are no active problems to display for this patient.   Past Surgical History:  Procedure Laterality Date  . CATARACT EXTRACTION W/PHACO Left 04/10/2019   Procedure: CATARACT EXTRACTION PHACO AND INTRAOCULAR LENS PLACEMENT (IOC);  Surgeon: Fabio Pierce, MD;  Location: [facility];  Service: Ophthalmology;  Laterality: Left;  CDE: 6.16  . CATARACT EXTRACTION W/PHACO Right 04/24/2019   Procedure: CATARACT EXTRACTION PHACO AND INTRAOCULAR LENS PLACEMENT RIGHT EYE;  Surgeon: Fabio Pierce, MD;  Location: [facility];  Service: Ophthalmology;  Laterality: Right;  CDE: 12.06  . LUMBAR DISC SURGERY    . ROTATOR CUFF REPAIR      Prior to Admission medications   Medication Sig Start Date End Date Taking? Authorizing Provider  albuterol (VENTOLIN HFA) 108 (90 Base) MCG/ACT inhaler Inhale 2 puffs into the lungs every 4 (four) hours as needed for wheezing or shortness of breath.    [provider]  atorvastatin (LIPITOR) 10 MG tablet  Take 10 mg by mouth every evening.    [provider]  colchicine 0.6 MG tablet Take 0.6 mg by mouth 2 (two) times daily.    [provider]  gabapentin (NEURONTIN) 100 MG capsule Take 100 mg by mouth 3 (three) times daily.    [provider]  hydroxychloroquine (PLAQUENIL) 200 MG tablet Take 200 mg by mouth 2 (two) times a day.    [provider]  insulin glargine (LANTUS) 100 UNIT/ML injection Inject 95 Units into the skin every evening.    [provider]  insulin lispro (HUMALOG) 100 UNIT/ML injection Inject 10-15 Units into the skin See admin instructions. Inject 12 units with breakfast, 10 units with lunch, and 15 units with dinner    [provider]  levocetirizine (XYZAL) 5 MG tablet Take 5 mg by mouth every evening.    [provider]  lidocaine (LIDODERM) 5 % Place 1 patch onto the skin daily. Remove & Discard patch within 12 hours or as directed by MD 09/18/19   Enid Derry, PA-C  losartan (COZAAR) 25 MG tablet Take 25 mg by mouth every evening.    [provider]  methocarbamol (ROBAXIN) 500 MG tablet Take 1 tablet (500 mg total) by mouth 3 (three) times daily for 3 days. 09/18/19 09/21/19  Enid Derry, PA-C  montelukast (SINGULAIR) 10 MG tablet Take 10 mg by mouth at bedtime.    [provider]  Olopatadine HCl (PATADAY OP) Place 1  drop into both eyes daily.    [provider]  Polyethyl Glycol-Propyl Glycol (SYSTANE OP) Place 1 drop into both eyes 2 (two) times a day.    [provider]  sitaGLIPtin (JANUVIA) 100 MG tablet Take 100 mg by mouth daily with supper.    [provider]  Tiotropium Bromide Monohydrate (SPIRIVA RESPIMAT) 2.5 MCG/ACT AERS Inhale 2 puffs into the lungs daily.    [provider]  traMADol (ULTRAM) 50 MG tablet Take 50 mg by mouth every 6 (six) hours as needed for moderate pain.    [provider]    Allergies Patient has no known  allergies.  No family history on file.  Social History Social History   Tobacco Use  . Smoking status: Former Smoker    Packs/day: 1.00    Years: 40.00    Pack years: 40.00    Quit date: 04/02/2014    Years since quitting: 5.4  . Smokeless tobacco: Never Used  Substance Use Topics  . Alcohol use: Never    Frequency: Never  . Drug use: Never     Review of Systems  Respiratory: No SOB. Gastrointestinal: No abdominal pain.  No nausea, no vomiting.  Musculoskeletal: Positive for shoulder and hip pain. Skin: Negative for rash, abrasions, lacerations, ecchymosis. Neurological: Negative for headaches, numbness or tingling   ____________________________________________   PHYSICAL EXAM:  VITAL SIGNS: ED Triage Vitals  Enc Vitals Group     BP 09/18/19 1126 (!) 143/71     Pulse Rate 09/18/19 1126 96     Resp 09/18/19 1126 18     Temp 09/18/19 1126 98.9 F (37.2 C)     Temp Source 09/18/19 1126 Oral     SpO2 09/18/19 1126 95 %     Weight 09/18/19 1127 240 lb (108.9 kg)     Height 09/18/19 1127 5\' 6"  (1.676 m)     Head Circumference --      Peak Flow --      Pain Score 09/18/19 1127 8     Pain Loc --      Pain Edu? --      Excl. in GC? --      Constitutional: Alert and oriented. Well appearing and in no acute distress. Eyes: Conjunctivae are normal. PERRL. EOMI. Head: Atraumatic. ENT:      Ears:      Nose: No congestion/rhinnorhea.      Mouth/Throat: Mucous membranes are moist.  Neck: No stridor.  No cervical spine tenderness to palpation. Cardiovascular: Normal rate, regular rhythm.  Good peripheral circulation. Respiratory: Normal respiratory effort without tachypnea or retractions. Lungs CTAB. Good air entry to the bases with no decreased or absent breath sounds. Gastrointestinal: Bowel sounds 4 quadrants. Soft and nontender to palpation. No guarding or rigidity. No palpable masses. No distention. No  Musculoskeletal: Full range of motion to all extremities. No  gross deformities appreciated.  Limited active range of motion of right shoulder due to pain.  Full passive range of motion of right shoulder.  Tenderness to palpation just superior to trochanteric bursa.  Full range of motion of left hip.  Weightbearing. Ambulating. Neurologic:  Normal speech and language. No gross focal neurologic deficits are appreciated.  Skin:  Skin is warm, dry and intact. No rash noted. Psychiatric: Mood and affect are normal. Speech and behavior are normal. Patient exhibits appropriate insight and judgement.   ____________________________________________   LABS (all labs ordered are listed, but only abnormal results are displayed)  Labs  Reviewed - No data to display ____________________________________________  EKG   ____________________________________________  RADIOLOGY Robinette Haines, personally viewed and evaluated these images (plain radiographs) as part of my medical decision making, as well as reviewing the written report by the radiologist.  Dg Shoulder Right  Result Date: 09/18/2019 CLINICAL DATA:  Right shoulder pain after fall today. EXAM: RIGHT SHOULDER - 2+ VIEW COMPARISON:  None. FINDINGS: There is no evidence of fracture or dislocation. There is no evidence of arthropathy or other focal bone abnormality. Soft tissues are unremarkable. IMPRESSION: Negative. Electronically Signed   By: Marijo Conception M.D.   On: 09/18/2019 12:57   Dg Hip Unilat With Pelvis 2-3 Views Left  Result Date: 09/18/2019 CLINICAL DATA:  Left hip pain after fall today. EXAM: DG HIP (WITH OR WITHOUT PELVIS) 2-3V LEFT COMPARISON:  None. FINDINGS: There is no evidence of hip fracture or dislocation. There is no evidence of arthropathy or other focal bone abnormality. IMPRESSION: Negative. Electronically Signed   By: Marijo Conception M.D.   On: 09/18/2019 12:56    ____________________________________________    PROCEDURES  Procedure(s) performed:     Procedures    Medications  lidocaine (LIDODERM) 5 % 1 patch (1 patch Transdermal Patch Applied 09/18/19 1343)  oxyCODONE-acetaminophen (PERCOCET/ROXICET) 5-325 MG per tablet 1 tablet (1 tablet Oral Given 09/18/19 1344)  baclofen (LIORESAL) tablet 5 mg (5 mg Oral Given 09/18/19 1419)     ____________________________________________   INITIAL IMPRESSION / ASSESSMENT AND PLAN / ED COURSE  Pertinent labs & imaging results that were available during my care of the patient were reviewed by me and considered in my medical decision making (see chart for details).  Review of the [place] CSRS was performed in accordance of the [place] prior to dispensing any controlled drugs.   Presented to emergency department for evaluation after fall.  Vital signs and exam are reassuring.  Shoulder x-ray and hip x-ray are negative for acute bony abnormalities.  Patient was given a walker.  He also requested a prescription for a toilet seat elevator.  He ambulated to the bathroom.  Patient will be discharged home with prescriptions for Robaxin.  Patient takes tramadol for chronic pain.  Patient is to follow up with primary care as directed. Patient is given ED precautions to return to the ED for any worsening or new symptoms.  Jonathan Wheeler was evaluated in Emergency Department on 09/18/2019 for the symptoms described in the history of present illness. He was evaluated in the context of the global COVID-19 pandemic, which necessitated consideration that the patient might be at risk for infection with the SARS-CoV-2 virus that causes COVID-19. Institutional protocols and algorithms that pertain to the evaluation of patients at risk for COVID-19 are in a state of rapid change based on information released by regulatory bodies including the CDC and federal and state organizations. These policies and algorithms were followed during the patient's care in the ED.   ____________________________________________  FINAL  CLINICAL IMPRESSION(S) / ED DIAGNOSES  Final diagnoses:  Fall, initial encounter      NEW MEDICATIONS STARTED DURING THIS VISIT:  ED Discharge Orders         Ordered    Elevated toilet seat     09/18/19 1348    methocarbamol (ROBAXIN) 500 MG tablet  3 times daily     09/18/19 1427    lidocaine (LIDODERM) 5 %  Every 24 hours     09/18/19 1427  This chart was dictated using voice recognition software/Dragon. Despite best efforts to proofread, errors can occur which can change the meaning. Any change was purely unintentional.    Enid Derry, PA-C 09/18/19 1441    Emily Filbert, MD 09/18/19 [id number]

## 2019-09-18 NOTE — ED Triage Notes (Signed)
Pt states he slipped and fell this morning around 430am when he was letting his dog out and injured his left hip and right shoulder. Pt is in NAD>

## 2019-11-08 ENCOUNTER — Other Ambulatory Visit: Payer: Self-pay

## 2019-11-08 ENCOUNTER — Ambulatory Visit: Payer: Medicare Other | Attending: Internal Medicine

## 2019-11-08 DIAGNOSIS — Z20822 Contact with and (suspected) exposure to covid-19: Secondary | ICD-10-CM

## 2019-11-09 LAB — NOVEL CORONAVIRUS, NAA: SARS-CoV-2, NAA: DETECTED — AB

## 2019-11-10 ENCOUNTER — Telehealth: Payer: Self-pay | Admitting: Nurse Practitioner

## 2019-11-10 ENCOUNTER — Emergency Department (HOSPITAL_COMMUNITY): Payer: Medicare Other

## 2019-11-10 ENCOUNTER — Emergency Department (HOSPITAL_COMMUNITY)
Admission: EM | Admit: 2019-11-10 | Discharge: 2019-11-10 | Disposition: A | Discharge status: Home / Self Care | Payer: Medicare Other | Attending: Emergency Medicine | Admitting: Emergency Medicine

## 2019-11-10 ENCOUNTER — Other Ambulatory Visit: Payer: Self-pay

## 2019-11-10 ENCOUNTER — Encounter (HOSPITAL_COMMUNITY): Payer: Self-pay | Admitting: Emergency Medicine

## 2019-11-10 DIAGNOSIS — J449 Chronic obstructive pulmonary disease, unspecified: Secondary | ICD-10-CM | POA: Diagnosis not present

## 2019-11-10 DIAGNOSIS — Z79899 Other long term (current) drug therapy: Secondary | ICD-10-CM | POA: Insufficient documentation

## 2019-11-10 DIAGNOSIS — Z87891 Personal history of nicotine dependence: Secondary | ICD-10-CM | POA: Diagnosis not present

## 2019-11-10 DIAGNOSIS — I10 Essential (primary) hypertension: Secondary | ICD-10-CM | POA: Diagnosis not present

## 2019-11-10 DIAGNOSIS — R05 Cough: Secondary | ICD-10-CM | POA: Diagnosis not present

## 2019-11-10 DIAGNOSIS — U071 COVID-19: Secondary | ICD-10-CM | POA: Diagnosis present

## 2019-11-10 DIAGNOSIS — Z794 Long term (current) use of insulin: Secondary | ICD-10-CM | POA: Diagnosis not present

## 2019-11-10 DIAGNOSIS — E119 Type 2 diabetes mellitus without complications: Secondary | ICD-10-CM | POA: Insufficient documentation

## 2019-11-10 DIAGNOSIS — R509 Fever, unspecified: Secondary | ICD-10-CM | POA: Diagnosis not present

## 2019-11-10 LAB — CBC WITH DIFFERENTIAL/PLATELET
Abs Immature Granulocytes: 0.02 10*3/uL (ref 0.00–0.07)
Basophils Absolute: 0 10*3/uL (ref 0.0–0.1)
Basophils Relative: 0 %
Eosinophils Absolute: 0 10*3/uL (ref 0.0–0.5)
Eosinophils Relative: 0 %
HCT: 49.6 % (ref 39.0–52.0)
Hemoglobin: 15.3 g/dL (ref 13.0–17.0)
Immature Granulocytes: 0 %
Lymphocytes Relative: 13 %
Lymphs Abs: 0.7 10*3/uL (ref 0.7–4.0)
MCH: 31 pg (ref 26.0–34.0)
MCHC: 30.8 g/dL (ref 30.0–36.0)
MCV: 100.6 fL — ABNORMAL HIGH (ref 80.0–100.0)
Monocytes Absolute: 0.9 10*3/uL (ref 0.1–1.0)
Monocytes Relative: 16 %
Neutro Abs: 4 10*3/uL (ref 1.7–7.7)
Neutrophils Relative %: 71 %
Platelets: 166 10*3/uL (ref 150–400)
RBC: 4.93 MIL/uL (ref 4.22–5.81)
RDW: 13.2 % (ref 11.5–15.5)
WBC: 5.6 10*3/uL (ref 4.0–10.5)
nRBC: 0 % (ref 0.0–0.2)

## 2019-11-10 LAB — COMPREHENSIVE METABOLIC PANEL
ALT: 28 U/L (ref 0–44)
AST: 34 U/L (ref 15–41)
Albumin: 4.1 g/dL (ref 3.5–5.0)
Alkaline Phosphatase: 56 U/L (ref 38–126)
Anion gap: 9 (ref 5–15)
BUN: 22 mg/dL (ref 8–23)
CO2: 24 mmol/L (ref 22–32)
Calcium: 8.7 mg/dL — ABNORMAL LOW (ref 8.9–10.3)
Chloride: 105 mmol/L (ref 98–111)
Creatinine, Ser: 1.66 mg/dL — ABNORMAL HIGH (ref 0.61–1.24)
GFR calc Af Amer: 49 mL/min — ABNORMAL LOW (ref >60)
GFR calc non Af Amer: 42 mL/min — ABNORMAL LOW (ref >60)
Glucose, Bld: 106 mg/dL — ABNORMAL HIGH (ref 70–99)
Potassium: 4.8 mmol/L (ref 3.5–5.1)
Sodium: 138 mmol/L (ref 135–145)
Total Bilirubin: 0.8 mg/dL (ref 0.3–1.2)
Total Protein: 8.1 g/dL (ref 6.5–8.1)

## 2019-11-10 LAB — LACTIC ACID, PLASMA: Lactic Acid, Venous: 1.4 mmol/L (ref 0.5–1.9)

## 2019-11-10 MED ORDER — SODIUM CHLORIDE 0.9% FLUSH
3.0000 mL | Freq: Once | INTRAVENOUS | Status: AC
  Administered 2019-11-10: 3 mL via INTRAVENOUS

## 2019-11-10 NOTE — ED Provider Notes (Signed)
[facility] EMERGENCY DEPARTMENT Provider Note   CSN: [id number] Arrival date & time: 11/10/19  1736     History Chief Complaint  Patient presents with  . Covid positive    Jonathan Wheeler is a 69 y.o. male.  Pt presents to the ED today with wanting an infusion of the monoclonal antibody.  The pt has recently tested positive for Covid-19.  He had heard about the monoclonal antibody infusion and was interested in it.  Pt said he was called this morning from [facility] regarding the infusion.  He said his phone was breaking up and he could not understand what the woman on the phone was saying.  Per notes, he is considered a candidate for it, but he had declined.  Pt said he wanted to ask his pcp's opinion.  He came here because he did not know exactly what to do.  He has a cough, but it is not bad.  His pcp has prescribed prednisone and zithromax already.  Pt has a fever here, but declines any meds because he said we overcharge and he'll just wait until he gets home.         Past Medical History:  Diagnosis Date  . COPD (chronic obstructive pulmonary disease) ([place])   . Diabetes mellitus without complication ([place])   . Hypertension     There are no problems to display for this patient.   Past Surgical History:  Procedure Laterality Date  . CATARACT EXTRACTION W/PHACO Left 04/10/2019   Procedure: CATARACT EXTRACTION PHACO AND INTRAOCULAR LENS PLACEMENT (IOC);  Surgeon: Baruch Goldmann, MD;  Location: [facility];  Service: Ophthalmology;  Laterality: Left;  CDE: 6.16  . CATARACT EXTRACTION W/PHACO Right 04/24/2019   Procedure: CATARACT EXTRACTION PHACO AND INTRAOCULAR LENS PLACEMENT RIGHT EYE;  Surgeon: Baruch Goldmann, MD;  Location: [facility];  Service: Ophthalmology;  Laterality: Right;  CDE: 12.06  . LUMBAR DISC SURGERY    . ROTATOR CUFF REPAIR         Family History  Problem Relation Age of Onset  . Cancer Mother   . Heart disease Father     Social History   Tobacco Use    . Smoking status: Former Smoker    Packs/day: 1.00    Years: 40.00    Pack years: 40.00    Quit date: 04/02/2014    Years since quitting: 5.6  . Smokeless tobacco: Never Used  Substance Use Topics  . Alcohol use: Never  . Drug use: Never    Home Medications Prior to Admission medications   Medication Sig Start Date End Date Taking? Authorizing Provider  albuterol (VENTOLIN HFA) 108 (90 Base) MCG/ACT inhaler Inhale 2 puffs into the lungs every 4 (four) hours as needed for wheezing or shortness of breath.    [provider]  atorvastatin (LIPITOR) 10 MG tablet Take 10 mg by mouth every evening.    [provider]  colchicine 0.6 MG tablet Take 0.6 mg by mouth 2 (two) times daily.    [provider]  gabapentin (NEURONTIN) 100 MG capsule Take 100 mg by mouth 3 (three) times daily.    [provider]  hydroxychloroquine (PLAQUENIL) 200 MG tablet Take 200 mg by mouth 2 (two) times a day.    [provider]  insulin glargine (LANTUS) 100 UNIT/ML injection Inject 95 Units into the skin every evening.    [provider]  insulin lispro (HUMALOG) 100 UNIT/ML injection Inject 10-15 Units into the skin See admin instructions.  Inject 12 units with breakfast, 10 units with lunch, and 15 units with dinner    [provider]  levocetirizine (XYZAL) 5 MG tablet Take 5 mg by mouth every evening.    [provider]  lidocaine (LIDODERM) 5 % Place 1 patch onto the skin daily. Remove & Discard patch within 12 hours or as directed by MD 09/18/19   Enid Derry, PA-C  losartan (COZAAR) 25 MG tablet Take 25 mg by mouth every evening.    [provider]  montelukast (SINGULAIR) 10 MG tablet Take 10 mg by mouth at bedtime.    [provider]  Olopatadine HCl (PATADAY OP) Place 1 drop into both eyes daily.    [provider]  Polyethyl Glycol-Propyl Glycol (SYSTANE OP) Place 1 drop into both eyes 2 (two) times a  day.    [provider]  sitaGLIPtin (JANUVIA) 100 MG tablet Take 100 mg by mouth daily with supper.    [provider]  Tiotropium Bromide Monohydrate (SPIRIVA RESPIMAT) 2.5 MCG/ACT AERS Inhale 2 puffs into the lungs daily.    [provider]  traMADol (ULTRAM) 50 MG tablet Take 50 mg by mouth every 6 (six) hours as needed for moderate pain.    [provider]    Allergies    Patient has no known allergies.  Review of Systems   Review of Systems  Constitutional: Positive for fever.  Respiratory: Positive for cough.   All other systems reviewed and are negative.   Physical Exam Updated Vital Signs BP 114/65 (BP Location: Right Arm)   Pulse (!) 118   Temp (!) 102.4 F (39.1 C) (Oral)   Resp 16   Ht 5\' 6"  (1.676 m)   Wt 108.9 kg   SpO2 96%   BMI 38.74 kg/m   Physical Exam Vitals and nursing note reviewed.  Constitutional:      Appearance: Normal appearance.  HENT:     Head: Normocephalic and atraumatic.     Right Ear: External ear normal.     Left Ear: External ear normal.     Nose: Nose normal.     Mouth/Throat:     Mouth: Mucous membranes are moist.     Pharynx: Oropharynx is clear.  Eyes:     Extraocular Movements: Extraocular movements intact.     Conjunctiva/sclera: Conjunctivae normal.     Pupils: Pupils are equal, round, and reactive to light.  Cardiovascular:     Rate and Rhythm: Normal rate and regular rhythm.     Pulses: Normal pulses.     Heart sounds: Normal heart sounds.  Pulmonary:     Effort: Pulmonary effort is normal.     Breath sounds: Normal breath sounds.  Abdominal:     General: Abdomen is flat. Bowel sounds are normal.     Palpations: Abdomen is soft.  Musculoskeletal:        General: Normal range of motion.     Cervical back: Normal range of motion and neck supple.  Skin:    General: Skin is warm.     Capillary Refill: Capillary refill takes less than 2 seconds.  Neurological:     General: No focal  deficit present.     Mental Status: He is alert and oriented to person, place, and time.  Psychiatric:        Mood and Affect: Mood normal.        Behavior: Behavior normal.        Thought Content: Thought content  normal.        Judgment: Judgment normal.     ED Results / Procedures / Treatments   Labs (all labs ordered are listed, but only abnormal results are displayed) Labs Reviewed  COMPREHENSIVE METABOLIC PANEL - Abnormal; Notable for the following components:      Result Value   Glucose, Bld 106 (*)    Creatinine, Ser 1.66 (*)    Calcium 8.7 (*)    GFR calc non Af Amer 42 (*)    GFR calc Af Amer 49 (*)    All other components within normal limits  CBC WITH DIFFERENTIAL/PLATELET - Abnormal; Notable for the following components:   MCV 100.6 (*)    All other components within normal limits  LACTIC ACID, PLASMA  URINALYSIS, ROUTINE W REFLEX MICROSCOPIC    EKG None  Radiology DG Chest Port 1 View  Result Date: 11/10/2019 CLINICAL DATA:  Positive COVID test EXAM: PORTABLE CHEST 1 VIEW COMPARISON:  None. FINDINGS: Likely chronic mild interstitial prominence related to COPD. Minor atelectasis at the left lung base. No pleural effusion or pneumothorax. Normal heart size. IMPRESSION: Minor left basilar atelectasis. Likely chronic mild interstitial prominence related to COPD. Electronically Signed   By: Guadlupe Spanish M.D.   On: 11/10/2019 21:16    Procedures Procedures (including critical care time)  Medications Ordered in ED Medications  sodium chloride flush (NS) 0.9 % injection 3 mL (3 mLs Intravenous Given 11/10/19 2047)    ED Course  I have reviewed the triage vital signs and the nursing notes.  Pertinent labs & imaging results that were available during my care of the patient were reviewed by me and considered in my medical decision making (see chart for details).    MDM Rules/Calculators/A&P                      Pt is encouraged to call [facility] back  if he decides he does want the infusion.  Pt is febrile, but refusing antipyretics.  He is tachycardic, but that is from the fever.  I don't think he has a PE as he is 100% O2.  He looks nontoxic.  He is told to return if worse.  F/u with pulm/pcp.  Jaycub Noorani was evaluated in Emergency Department on 11/10/2019 for the symptoms described in the history of present illness. He was evaluated in the context of the global COVID-19 pandemic, which necessitated consideration that the patient might be at risk for infection with the SARS-CoV-2 virus that causes COVID-19. Institutional protocols and algorithms that pertain to the evaluation of patients at risk for COVID-19 are in a state of rapid change based on information released by regulatory bodies including the CDC and federal and state organizations. These policies and algorithms were followed during the patient's care in the ED.  Final Clinical Impression(s) / ED Diagnoses Final diagnoses:  COVID-19 virus infection    Rx / DC Orders ED Discharge Orders    None       Jacalyn Lefevre, MD 11/10/19 2142

## 2019-11-10 NOTE — Discharge Instructions (Addendum)
Alternate tylenol and ibuprofen for fever. °

## 2019-11-10 NOTE — ED Triage Notes (Signed)
Patient refuses Tylenol or Ibuprofen for fever.

## 2019-11-10 NOTE — Telephone Encounter (Signed)
Called to Discuss with patient about Covid symptoms and the use of bamlanivimab, a monoclonal antibody infusion for those with mild to moderate Covid symptoms and at a high risk of hospitalization.     Pt is qualified for this infusion at the [facility] infusion center due to co-morbid conditions and/or a member of an at-risk group.     There are no problems to display for this patient.   Patient declines infusion at this time. Symptoms tier reviewed as well as criteria for ending isolation. Preventative practices reviewed. Patient verbalized understanding.    Patient advised to call back if he decides that he does want to get infusion. Callback number to the infusion center given. Patient advised to go to Urgent care or ED with severe symptoms.    Note: symptoms started on 11/04/19 - last day for infusion would be 11/13/19.

## 2019-11-10 NOTE — ED Triage Notes (Signed)
Patient states he was sent by UC for evaluation after testing positive for Covid.

## 2020-08-13 IMAGING — CR DG HIP (WITH OR WITHOUT PELVIS) 2-3V*L*
3 series · 3 of 3 positions shown · non-contrast
Comparison: None.

CLINICAL DATA: Left hip pain after fall today.

EXAM:
DG HIP (WITH OR WITHOUT PELVIS) 2-3V LEFT

[pelvis ap]
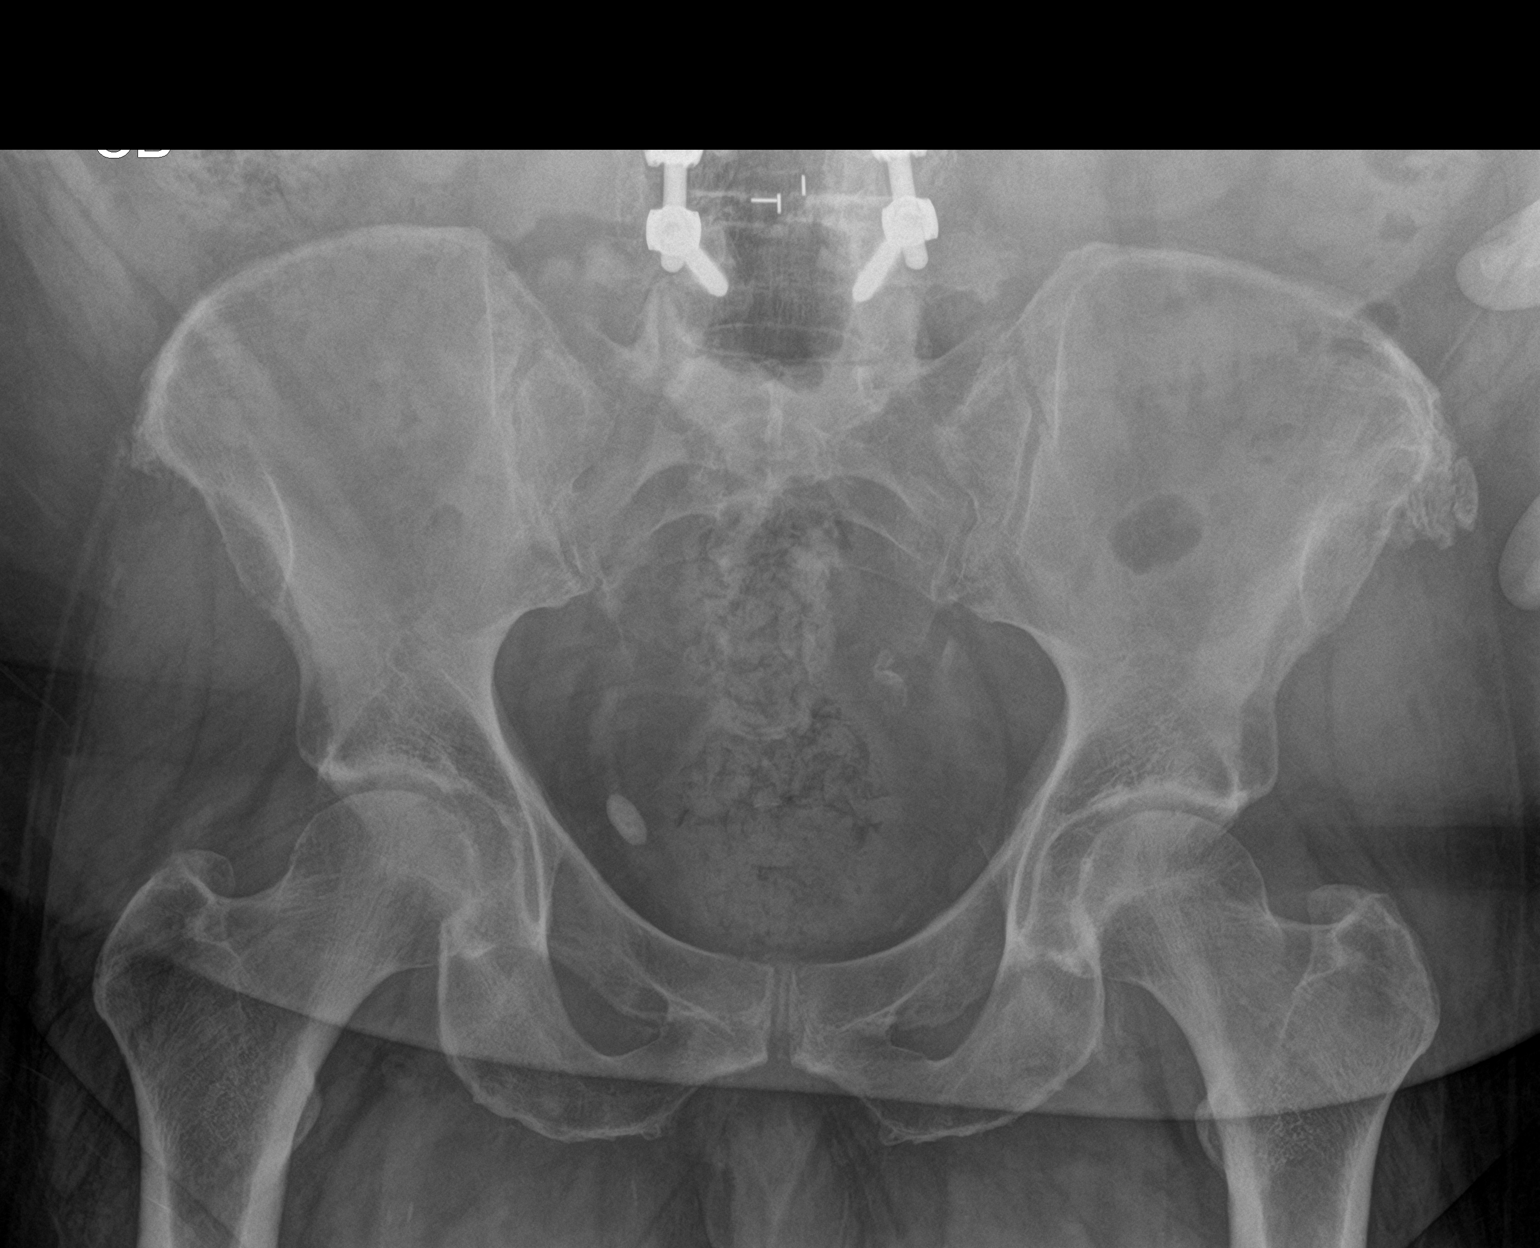

[hip ap]
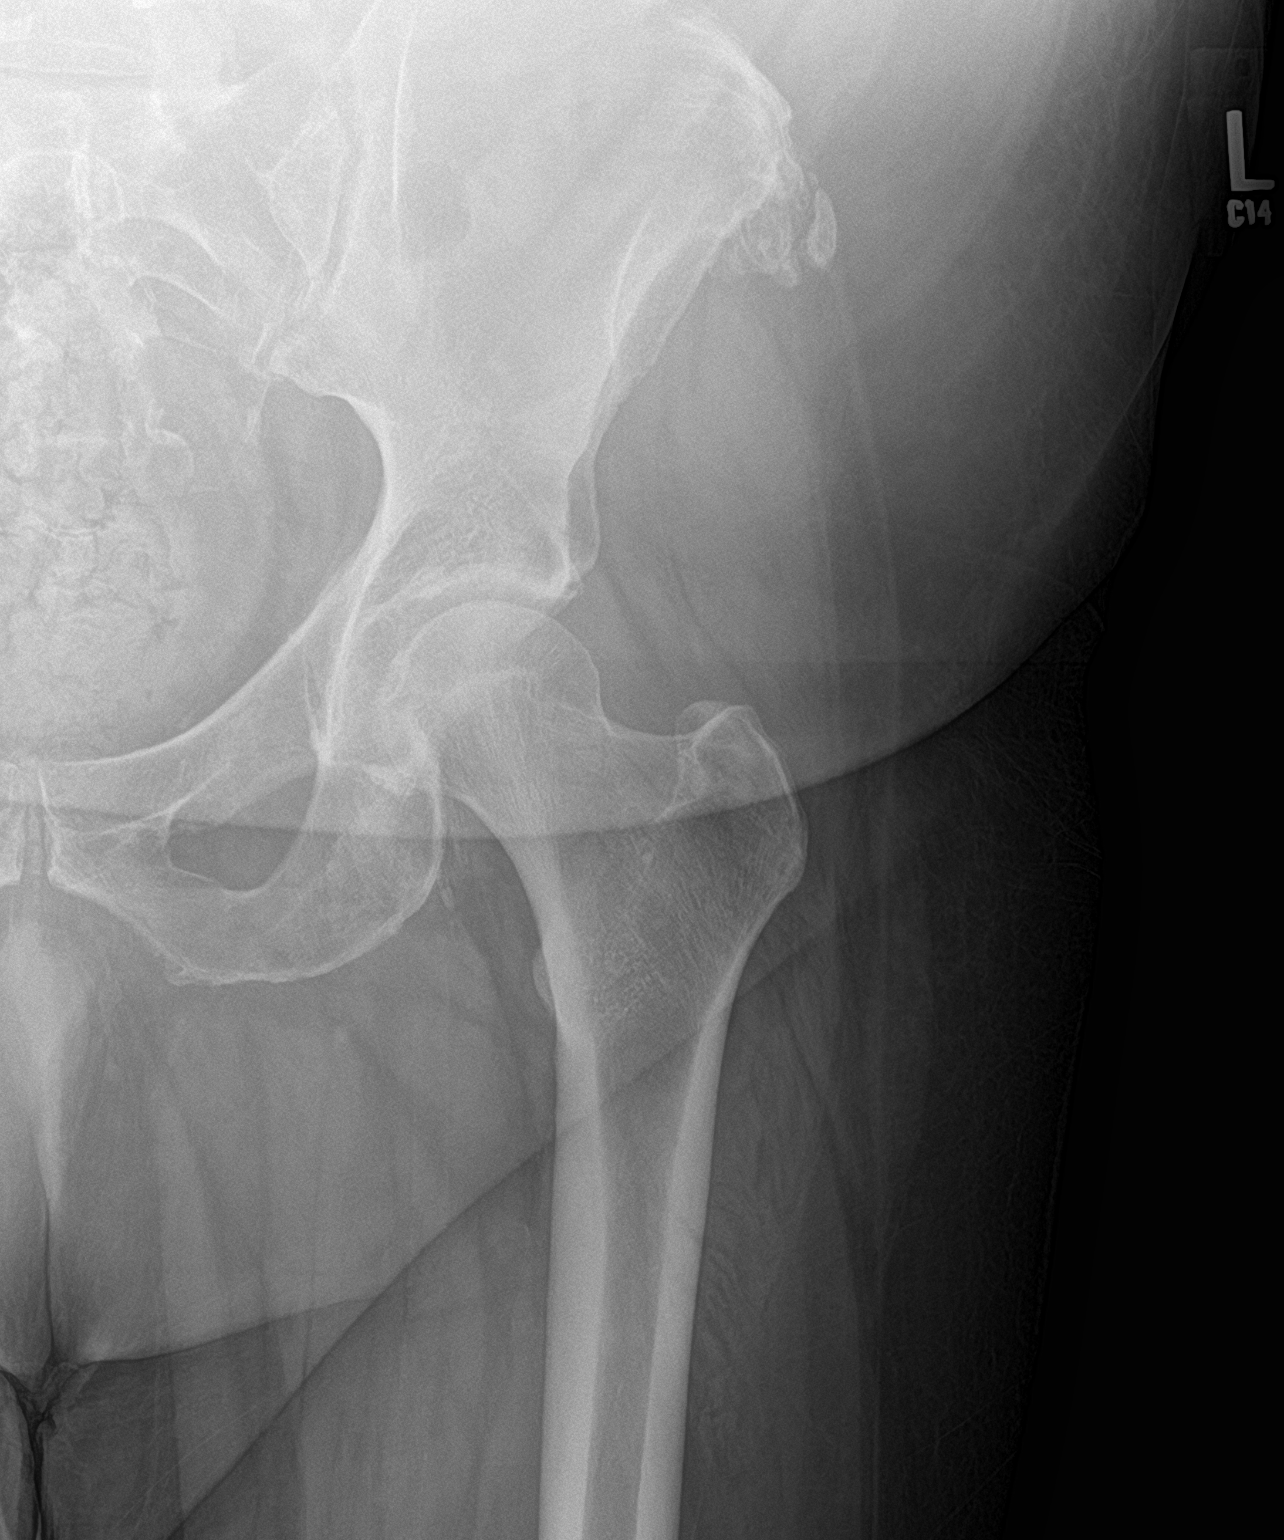

[hip lat]
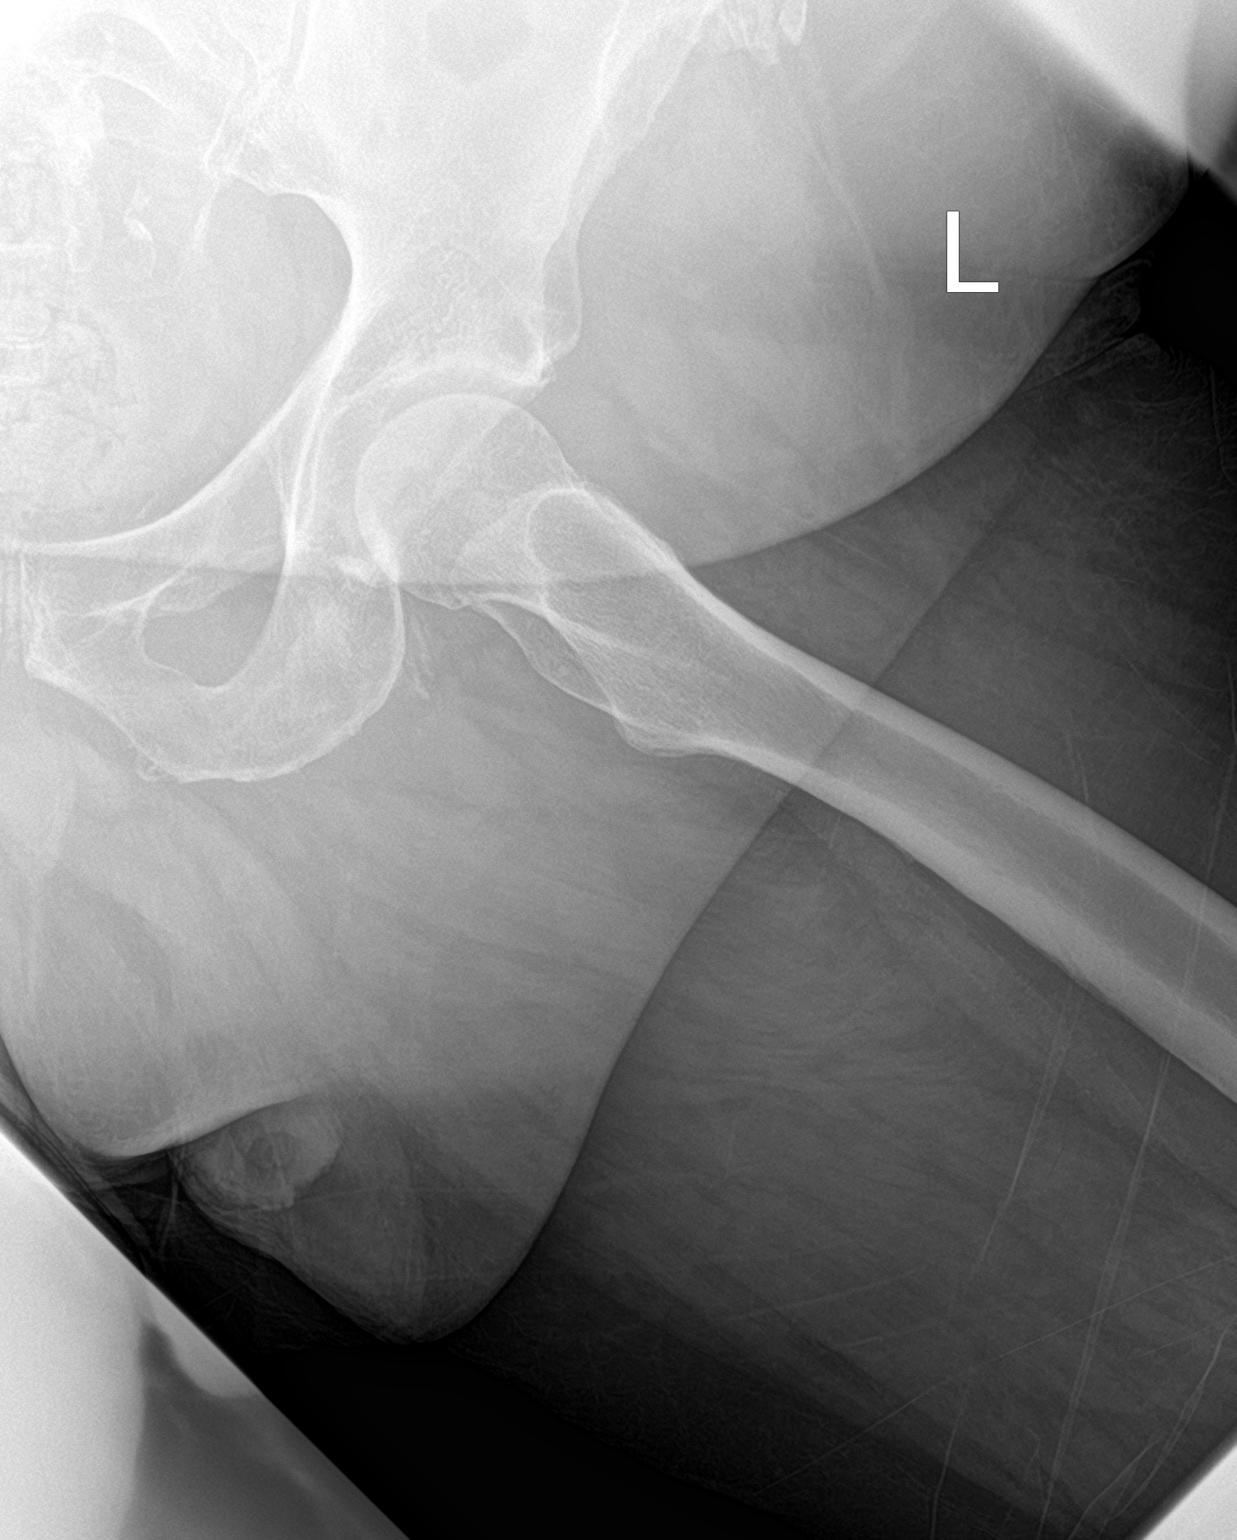

[3 of 3 positions shown; findings below may reference images not displayed]

FINDINGS: There is no evidence of hip fracture or dislocation. There is no
evidence of arthropathy or other focal bone abnormality.
IMPRESSION: Negative.

## 2020-10-05 IMAGING — DX DG CHEST 1V PORT
1 series · 1 of 1 positions shown · non-contrast
Comparison: None.

CLINICAL DATA: Positive COVID test

EXAM:
PORTABLE CHEST 1 VIEW

[chest ap grid]
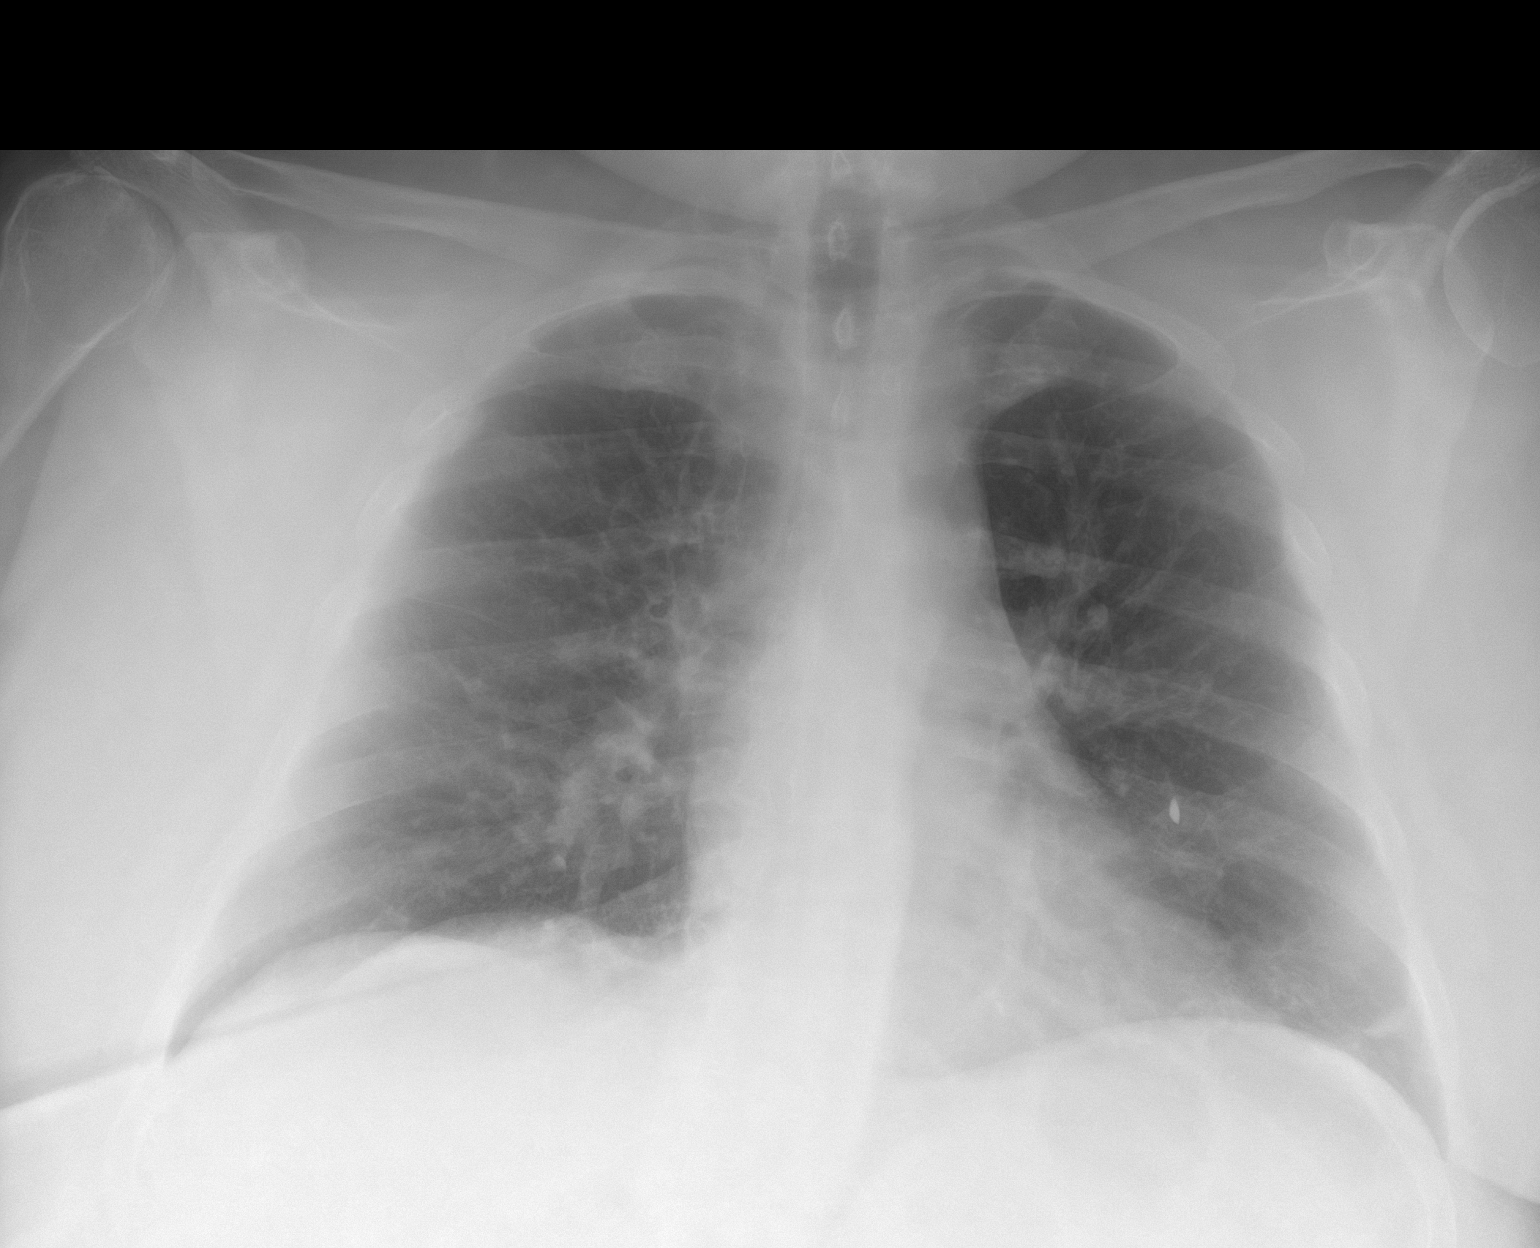

[1 of 1 positions shown; findings below may reference images not displayed]

FINDINGS: Likely chronic mild interstitial prominence related to COPD. Minor
atelectasis at the left lung base. No pleural effusion or
pneumothorax. Normal heart size.
IMPRESSION: Minor left basilar atelectasis. Likely chronic mild interstitial
prominence related to COPD.

## 2021-10-31 ENCOUNTER — Other Ambulatory Visit: Payer: Self-pay

## 2021-10-31 ENCOUNTER — Encounter (HOSPITAL_COMMUNITY): Payer: Self-pay

## 2021-10-31 ENCOUNTER — Emergency Department (HOSPITAL_COMMUNITY)
Admission: EM | Admit: 2021-10-31 | Discharge: 2021-10-31 | Disposition: A | Discharge status: Home / Self Care | Payer: Medicare Other | Attending: Emergency Medicine | Admitting: Emergency Medicine

## 2021-10-31 DIAGNOSIS — Z87891 Personal history of nicotine dependence: Secondary | ICD-10-CM | POA: Diagnosis not present

## 2021-10-31 DIAGNOSIS — I10 Essential (primary) hypertension: Secondary | ICD-10-CM | POA: Insufficient documentation

## 2021-10-31 DIAGNOSIS — Z794 Long term (current) use of insulin: Secondary | ICD-10-CM | POA: Insufficient documentation

## 2021-10-31 DIAGNOSIS — Z20822 Contact with and (suspected) exposure to covid-19: Secondary | ICD-10-CM | POA: Diagnosis not present

## 2021-10-31 DIAGNOSIS — E119 Type 2 diabetes mellitus without complications: Secondary | ICD-10-CM | POA: Diagnosis not present

## 2021-10-31 DIAGNOSIS — R0989 Other specified symptoms and signs involving the circulatory and respiratory systems: Secondary | ICD-10-CM | POA: Diagnosis present

## 2021-10-31 DIAGNOSIS — J449 Chronic obstructive pulmonary disease, unspecified: Secondary | ICD-10-CM | POA: Diagnosis not present

## 2021-10-31 DIAGNOSIS — Z7984 Long term (current) use of oral hypoglycemic drugs: Secondary | ICD-10-CM | POA: Insufficient documentation

## 2021-10-31 LAB — RESP PANEL BY RT-PCR (FLU A&B, COVID) ARPGX2
Influenza A by PCR: NEGATIVE
Influenza B by PCR: NEGATIVE
SARS Coronavirus 2 by RT PCR: NEGATIVE

## 2021-10-31 MED ORDER — MOLNUPIRAVIR EUA 200MG CAPSULE: 4.0000 | ORAL_CAPSULE | Freq: Two times a day (BID) | ORAL | 0 refills | Status: AC

## 2021-10-31 NOTE — ED Triage Notes (Signed)
Pt here with spouse and wants to be tested for covid, unsure about results from home test, pt has sore throat, no other sx.

## 2021-10-31 NOTE — Discharge Instructions (Addendum)
Follow-up with your COVID testing on MyChart  If positive you may start taking the antiviral medication  Have close follow-up with primary care provider  Return for new or worsening symptoms

## 2021-10-31 NOTE — ED Provider Notes (Signed)
[facility] EMERGENCY DEPARTMENT Provider Note   CSN: [id number] Arrival date & time: 10/31/21  1934     History Chief Complaint  Patient presents with   wants to be tested for covid    Jonathan Wheeler is a 70 y.o. male with past medical history significant for COPD, diabetes, hypertension who presents for evaluation of requesting COVID test.  Patient here with wife with similar request.  Took a home test which he is unsure of the results.  States he has a scratchy throat her denies any overt pain.  Tolerating p.o. intake.  No fever, chills, nausea, vomiting, cough, congestion, rhinorrhea, neck pain, neck stiffness, abdominal pain.  Denies additional aggravating or relieving factors.  History obtained from patient and past medical records.  No interpreter is used  HPI     Past Medical History:  Diagnosis Date   COPD (chronic obstructive pulmonary disease) (HCC)    Diabetes mellitus without complication (HCC)    Hypertension     There are no problems to display for this patient.   Past Surgical History:  Procedure Laterality Date   CATARACT EXTRACTION W/PHACO Left 04/10/2019   Procedure: CATARACT EXTRACTION PHACO AND INTRAOCULAR LENS PLACEMENT (IOC);  Surgeon: Fabio Pierce, MD;  Location: [facility];  Service: Ophthalmology;  Laterality: Left;  CDE: 6.16   CATARACT EXTRACTION W/PHACO Right 04/24/2019   Procedure: CATARACT EXTRACTION PHACO AND INTRAOCULAR LENS PLACEMENT RIGHT EYE;  Surgeon: Fabio Pierce, MD;  Location: [facility];  Service: Ophthalmology;  Laterality: Right;  CDE: 12.06   LUMBAR DISC SURGERY     ROTATOR CUFF REPAIR         Family History  Problem Relation Age of Onset   Cancer Mother    Heart disease Father     Social History   Tobacco Use   Smoking status: Former    Packs/day: 1.00    Years: 40.00    Pack years: 40.00    Types: Cigarettes    Quit date: 04/02/2014    Years since quitting: 7.5   Smokeless tobacco: Never  Vaping Use   Vaping Use: Never  used  Substance Use Topics   Alcohol use: Never   Drug use: Never    Home Medications Prior to Admission medications   Medication Sig Start Date End Date Taking? Authorizing Provider  molnupiravir EUA (LAGEVRIO) 200 mg CAPS capsule Take 4 capsules (800 mg total) by mouth 2 (two) times daily for 5 days. 10/31/21 11/05/21 Yes Maxden Naji A, PA-C  albuterol (VENTOLIN HFA) 108 (90 Base) MCG/ACT inhaler Inhale 2 puffs into the lungs every 4 (four) hours as needed for wheezing or shortness of breath.    [provider]  atorvastatin (LIPITOR) 10 MG tablet Take 10 mg by mouth every evening.    [provider]  colchicine 0.6 MG tablet Take 0.6 mg by mouth 2 (two) times daily.    [provider]  gabapentin (NEURONTIN) 100 MG capsule Take 100 mg by mouth 3 (three) times daily.    [provider]  hydroxychloroquine (PLAQUENIL) 200 MG tablet Take 200 mg by mouth 2 (two) times a day.    [provider]  insulin glargine (LANTUS) 100 UNIT/ML injection Inject 95 Units into the skin every evening.    [provider]  insulin lispro (HUMALOG) 100 UNIT/ML injection Inject 10-15 Units into the skin See admin instructions. Inject 12 units with breakfast, 10 units with lunch, and 15 units with dinner    [provider]  levocetirizine (XYZAL) 5 MG tablet Take 5 mg by mouth every evening.    [provider]  lidocaine (LIDODERM) 5 % Place 1 patch onto the skin daily. Remove & Discard patch within 12 hours or as directed by MD 09/18/19   Enid Derry, PA-C  losartan (COZAAR) 25 MG tablet Take 25 mg by mouth every evening.    [provider]  montelukast (SINGULAIR) 10 MG tablet Take 10 mg by mouth at bedtime.    [provider]  Olopatadine HCl (PATADAY OP) Place 1 drop into both eyes daily.    [provider]  Polyethyl Glycol-Propyl Glycol (SYSTANE OP) Place 1 drop into both eyes 2 (two) times a day.     [provider]  sitaGLIPtin (JANUVIA) 100 MG tablet Take 100 mg by mouth daily with supper.    [provider]  Tiotropium Bromide Monohydrate (SPIRIVA RESPIMAT) 2.5 MCG/ACT AERS Inhale 2 puffs into the lungs daily.    [provider]  traMADol (ULTRAM) 50 MG tablet Take 50 mg by mouth every 6 (six) hours as needed for moderate pain.    [provider]    Allergies    Patient has no known allergies.  Review of Systems   Review of Systems  Constitutional: Negative.   HENT:  Positive for sore throat. Negative for congestion, drooling, ear discharge, ear pain, facial swelling, postnasal drip, rhinorrhea, sinus pressure, sinus pain, sneezing, trouble swallowing and voice change.   Respiratory: Negative.    Cardiovascular: Negative.   Gastrointestinal: Negative.   Genitourinary: Negative.   Musculoskeletal: Negative.   Skin: Negative.   Neurological: Negative.   All other systems reviewed and are negative.  Physical Exam Updated Vital Signs BP 122/61 (BP Location: Right Arm)    Pulse 98    Temp 98.5 F (36.9 C) (Oral)    Resp 16    Ht 5\' 6"  (1.676 m)    Wt 111.1 kg    SpO2 95%    BMI 39.54 kg/m   Physical Exam Vitals and nursing note reviewed.  Constitutional:      General: He is not in acute distress.    Appearance: He is well-developed. He is not ill-appearing, toxic-appearing or diaphoretic.  HENT:     Head: Normocephalic and atraumatic.     Nose: Nose normal. No congestion or rhinorrhea.     Mouth/Throat:     Comments: Posterior oropharynx clear.  No posterior oropharyngeal erythema, exudate.  Uvula midline.  No tonsillar exudate, edema, erythema.  No pooling of secretions.  Midline.  No sublingual swelling Eyes:     Pupils: Pupils are equal, round, and reactive to light.  Cardiovascular:     Rate and Rhythm: Normal rate and regular rhythm.  Pulmonary:     Effort: Pulmonary effort is normal. No respiratory distress.  Abdominal:      General: There is no distension.     Palpations: Abdomen is soft.  Musculoskeletal:        General: Normal range of motion.     Cervical back: Normal range of motion and neck supple.  Skin:    General: Skin is warm and dry.  Neurological:     General: No focal deficit present.     Mental Status: He is alert and oriented to person, place, and time.    ED Results / Procedures / Treatments   Labs (all labs ordered are listed, but only abnormal results are displayed) Labs Reviewed  RESP PANEL BY RT-PCR (FLU  A&B, COVID) [id number]    EKG None  Radiology No results found.  Procedures Procedures   Medications Ordered in ED Medications - No data to display  ED Course  I have reviewed the triage vital signs and the nursing notes.  Pertinent labs & imaging results that were available during my care of the patient were reviewed by me and considered in my medical decision making (see chart for details).  Pleasant 70 year old here for evaluation of requesting COVID test.  Has had a scratchy throat her denies any overt pain.  He is afebrile, nonseptic, not ill-appearing.  Posterior oropharynx clear.  Uvula midline.  No evidence of p.o. erythema, exudate.  No pooling of secretions.  Sublingual area soft.  He is tolerating p.o. intake.  Will obtain COVID test.  Symptoms not consistent with pharyngitis, no obvious PTA or RPA on exam.  He will follow-up with results on MyChart.  He is on multiple medications which would be contraindicated for Paxlovid. Will write for other antiviral given high risk of his testing was positive.  Encourage follow-up with PCP, return for new or worsening symptoms.  He is agreeable.  The patient has been appropriately medically screened and/or stabilized in the ED. I have low suspicion for any other emergent medical condition which would require further screening, evaluation or treatment in the ED or require inpatient management.  Patient is hemodynamically stable  and in no acute distress.  Patient able to ambulate in department prior to ED.  Evaluation does not show acute pathology that would require ongoing or additional emergent interventions while in the emergency department or further inpatient treatment.  I have discussed the diagnosis with the patient and answered all questions.  Pain is been managed while in the emergency department and patient has no further complaints prior to discharge.  Patient is comfortable with plan discussed in room and is stable for discharge at this time.  I have discussed strict return precautions for returning to the emergency department.  Patient was encouraged to follow-up with PCP/specialist refer to at discharge.     MDM Rules/Calculators/A&P                             Final Clinical Impression(s) / ED Diagnoses Final diagnoses:  Encounter for screening laboratory testing for COVID-19 virus    Rx / DC Orders ED Discharge Orders          Ordered    molnupiravir EUA (LAGEVRIO) 200 mg CAPS capsule  2 times daily        10/31/21 2128             Wren Gallaga A, PA-C 10/31/21 2129    Eber Hong, MD 11/01/21 1109

## 2023-11-01 ENCOUNTER — Ambulatory Visit: Payer: Medicare HMO | Admitting: Internal Medicine

## 2023-11-01 ENCOUNTER — Encounter: Payer: Self-pay | Admitting: Internal Medicine

## 2023-11-01 VITALS — BP 122/74 | HR 96 | Temp 98.0°F | Resp 16 | Ht 65.5 in | Wt 228.4 lb

## 2023-11-01 DIAGNOSIS — J3089 Other allergic rhinitis: Secondary | ICD-10-CM | POA: Diagnosis not present

## 2023-11-01 DIAGNOSIS — J343 Hypertrophy of nasal turbinates: Secondary | ICD-10-CM | POA: Diagnosis not present

## 2023-11-01 NOTE — Progress Notes (Signed)
NEW PATIENT  Date of Service/Encounter:  11/01/23  Consult requested by: [facility]   Subjective:   Jonathan Wheeler (DOB: 09-03-1951) is a 72 y.o. male who presents to the clinic on 11/01/2023 with a chief complaint of Allergic Rhinitis  (Sneezing,runny nose, watery eyes) and Pruritus (Itching around the eyes. ) .    History obtained from: chart review and patient.   COPD: Reports doing well.  Not much SOB/wheezing/coughing. Rarely uses albuterol.  Followed by PCP.   Rhinitis:  Started a few years ago.  Symptoms include: nasal congestion, rhinorrhea, sneezing, watery eyes, and itchy eyes  Occurs year-round Potential triggers: not sure   Treatments tried:  Singulair  PRN anti histamines  Previous allergy testing: yes; last one was 15 years ago  History of sinus surgery: none  Nonallergic triggers: none     Reviewed:  10/04/2023: seen by [facility] for CPPD on plaquenil and colchicine.   05/03/2023: seen by Dr Marcello Fennel for DM, CKD, COPD, allergic rhinitis. On Flonase, Allegra, Singulair, Albuterol PRN.   05/10/2018: seen by Dr Thedore Mins for mild COPD, CKD, eczema, DM.Previously on Spiriva.  Past Medical History: Past Medical History:  Diagnosis Date   COPD (chronic obstructive pulmonary disease) (HCC)    Diabetes mellitus without complication (HCC)    Hypertension     Past Surgical History: Past Surgical History:  Procedure Laterality Date   CATARACT EXTRACTION W/PHACO Left 04/10/2019   Procedure: CATARACT EXTRACTION PHACO AND INTRAOCULAR LENS PLACEMENT (IOC);  Surgeon: Fabio Pierce, MD;  Location: [facility];  Service: Ophthalmology;  Laterality: Left;  CDE: 6.16   CATARACT EXTRACTION W/PHACO Right 04/24/2019   Procedure: CATARACT EXTRACTION PHACO AND INTRAOCULAR LENS PLACEMENT RIGHT EYE;  Surgeon: Fabio Pierce, MD;  Location: [facility];  Service: Ophthalmology;  Laterality: Right;  CDE: 12.06   LUMBAR DISC SURGERY     ROTATOR CUFF REPAIR      Family  History: Family History  Problem Relation Age of Onset   Cancer Mother    Heart disease Father    Allergic rhinitis Neg Hx    Angioedema Neg Hx    Asthma Neg Hx    Eczema Neg Hx    Urticaria Neg Hx     Social History:  Flooring in bedroom: wood Pets: dog Tobacco use/exposure: quit 2015; 1 ppd for 40 years  Job: retired   Medication List:  Allergies as of 11/01/2023   No Known Allergies      Medication List        Accurate as of November 01, 2023  1:56 PM. If you have any questions, ask your nurse or doctor.          albuterol 108 (90 Base) MCG/ACT inhaler Commonly known as: VENTOLIN HFA Inhale 2 puffs into the lungs every 4 (four) hours as needed for wheezing or shortness of breath.   atorvastatin 10 MG tablet Commonly known as: LIPITOR Take 10 mg by mouth every evening.   colchicine 0.6 MG tablet Take 0.6 mg by mouth 2 (two) times daily.   Dexcom G7 Sensor Misc Take by mouth.   fexofenadine 180 MG tablet Commonly known as: ALLEGRA Take by mouth.   gabapentin 100 MG capsule Commonly known as: NEURONTIN Take 100 mg by mouth 3 (three) times daily.   hydrocortisone 2.5 % lotion APPLY EXTERNALLY TO THE AFFECTED AREA DAILY AS NEEDED   hydroxychloroquine 200 MG tablet Commonly known as: PLAQUENIL Take 200 mg by mouth 2 (two) times a day.  insulin glargine 100 UNIT/ML injection Commonly known as: LANTUS Inject 95 Units into the skin every evening.   insulin lispro 100 UNIT/ML injection Commonly known as: HUMALOG Inject 10-15 Units into the skin See admin instructions. Inject 12 units with breakfast, 10 units with lunch, and 15 units with dinner   levocetirizine 5 MG tablet Commonly known as: XYZAL Take 5 mg by mouth every evening.   lidocaine 5 % Commonly known as: Lidoderm Place 1 patch onto the skin daily. Remove & Discard patch within 12 hours or as directed by MD   losartan 25 MG tablet Commonly known as: COZAAR Take 25 mg by mouth every  evening.   montelukast 10 MG tablet Commonly known as: SINGULAIR Take 10 mg by mouth at bedtime.   Mounjaro 10 MG/0.5ML Pen Generic drug: tirzepatide Inject into the skin.   PATADAY OP Place 1 drop into both eyes daily.   sitaGLIPtin 100 MG tablet Commonly known as: JANUVIA Take 100 mg by mouth daily with supper.   Spiriva Respimat 2.5 MCG/ACT Aers Generic drug: Tiotropium Bromide Monohydrate Inhale 2 puffs into the lungs daily.   SYSTANE OP Place 1 drop into both eyes 2 (two) times a day.   traMADol 50 MG tablet Commonly known as: ULTRAM Take 50 mg by mouth every 6 (six) hours as needed for moderate pain.   triamcinolone cream 0.1 % Commonly known as: KENALOG APPLY TOPICALLY TO THE AFFECTED AREA TWICE DAILY         REVIEW OF SYSTEMS: Pertinent positives and negatives discussed in HPI.   Objective:   Physical Exam: BP 122/74   Pulse 96   Temp 98 F (36.7 C)   Resp 16   Ht 5' 5.5" (1.664 m)   Wt 228 lb 6 oz (103.6 kg)   SpO2 94%   BMI 37.43 kg/m  Body mass index is 37.43 kg/m. GEN: alert, well developed HEENT: clear conjunctiva,  nose with + mild inferior turbinate hypertrophy, pink nasal mucosa, + clear rhinorrhea, + cobblestoning HEART: regular rate and rhythm, no murmur LUNGS: clear to auscultation bilaterally, no coughing, unlabored respiration ABDOMEN: soft, non distended  SKIN: no rashes or lesions  Assessment:   1. Other allergic rhinitis   2. Nasal turbinate hypertrophy     Plan/Recommendations:  Other Allergic Rhinitis: - Due to turbinate hypertrophy and unresponsive to over the counter meds, will perform skin testing to identify aeroallergen triggers.   - Use nasal saline rinses before nose sprays such as with Neilmed Sinus Rinse.  Use distilled water.   - Use Singulair 10mg  daily.  Stop if there are any mood/behavioral changes. - Hold all anti-histamines (Xyzal, Allegra, Zyrtec, Claritin, Benadryl) 3 days prior to next visit.     Follow up: 130 PM on 1/13 for 1-55, IDs okay    Alesia Morin, MD [facility]

## 2023-11-01 NOTE — Patient Instructions (Addendum)
Other Allergic Rhinitis: - Due to turbinate hypertrophy and unresponsive to over the counter meds, will perform skin testing to identify aeroallergen triggers.   - Use nasal saline rinses before nose sprays such as with Neilmed Sinus Rinse.  Use distilled water.   - Use Singulair 10mg  daily.  Stop if there are any mood/behavioral changes. - Hold all anti-histamines (Xyzal, Allegra, Zyrtec, Claritin, Benadryl) 3 days prior to next visit.   Follow up: 130 PM on 1/13 for 1-55, IDs okay

## 2023-11-15 ENCOUNTER — Ambulatory Visit: Payer: Medicare HMO | Admitting: Internal Medicine

## 2023-11-15 DIAGNOSIS — J3089 Other allergic rhinitis: Secondary | ICD-10-CM | POA: Diagnosis not present

## 2023-11-15 MED ORDER — FLUTICASONE PROPIONATE 50 MCG/ACT NA SUSP: 2.0000 | Freq: Every day | NASAL | 5 refills | Status: AC

## 2023-11-15 MED ORDER — LEVOCETIRIZINE DIHYDROCHLORIDE 5 MG PO TABS: 5.0000 mg | ORAL_TABLET | Freq: Every evening | ORAL | 5 refills | Status: DC

## 2023-11-15 MED ORDER — MONTELUKAST SODIUM 10 MG PO TABS: 10.0000 mg | ORAL_TABLET | Freq: Every day | ORAL | 5 refills | Status: DC

## 2023-11-15 NOTE — Patient Instructions (Addendum)
 Allergic Rhinitis: - SPT 11/2023: molds, dust mites, cockroach - Avoidance measures discussed. - Use nasal saline rinses before nose sprays such as with Neilmed Sinus Rinse.  Use distilled water.   - Use Flonase  2 sprays each nostril daily. Aim upward and outward. - Use Xyzal  5mg  daily.  - Use Singulair  10mg  daily.   Stop if there are any mood/behavioral changes. - Consider allergy  shots as long term control of your symptoms by teaching your immune system to be more tolerant of your allergy  triggers  ALLERGEN AVOIDANCE MEASURES   Dust Mites Use central air conditioning and heat; and change the filter monthly.  Pleated filters work better than mesh filters.  Electrostatic filters may also be used; wash the filter monthly.  Window air conditioners may be used, but do not clean the air as well as a central air conditioner.  Change or wash the filter monthly. Keep windows closed.  Do not use attic fans.   Encase the mattress, box springs and pillows with zippered, dust proof covers. Wash the bed linens in hot water weekly.   Remove carpet, especially from the bedroom. Remove stuffed animals, throw pillows, dust ruffles, heavy drapes and other items that collect dust from the bedroom. Do not use a humidifier.   Use wood, vinyl or leather furniture instead of cloth furniture in the bedroom. Keep the indoor humidity at 30 - 40%.  Molds - Indoor avoidance Use air conditioning to reduce indoor humidity.  Do not use a humidifier. Keep indoor humidity at 30 - 40%.  Use a dehumidifier if needed. In the bathroom use an exhaust fan or open a window after showering.  Wipe down damp surfaces after showering.  Clean bathrooms with a mold-killing solution (diluted bleach, or products like Tilex, etc) at least once a month. In the kitchen use an exhaust fan to remove steam from cooking.  Throw away spoiled foods immediately, and empty garbage daily.  Empty water pans below self-defrosting refrigerators  frequently. Vent the clothes dryer to the outside. Limit indoor houseplants; mold grows in the dirt.  No houseplants in the bedroom. Remove carpet from the bedroom. Encase the mattress and box springs with a zippered encasing.  Molds - Outdoor avoidance Avoid being outside when the grass is being mowed, or the ground is tilled. Avoid playing in leaves, pine straw, hay, etc.  Dead plant materials contain mold. Avoid going into barns or grain storage areas. Remove leaves, clippings and compost from around the home.  Cockroach Limit spread of food around the house; especially keep food out of bedrooms. Keep food and garbage in closed containers with a tight lid.  Never leave food out in the kitchen.  Do not leave out pet food or dirty food bowls. Mop the kitchen floor and wash countertops at least once a week. Repair leaky pipes and faucets so there is no standing water to attract roaches. Plug up cracks in the house through which cockroaches can enter. Use bait stations and approved pesticides to reduce cockroach infestation.

## 2023-11-15 NOTE — Progress Notes (Signed)
 FOLLOW UP Date of Service/Encounter:  11/15/23   Subjective:  Jonathan Wheeler (DOB: 07-02-1951) is a 73 y.o. male who [facility] for follow up for skin testing.   History obtained from: chart review and patient.  Anti histamines held.   Past Medical History: Past Medical History:  Diagnosis Date   COPD (chronic obstructive pulmonary disease) (HCC)    Diabetes mellitus without complication (HCC)    Hypertension     Objective:  There were no vitals taken for this visit. There is no height or weight on file to calculate BMI. Physical Exam: GEN: alert, well developed HEENT: clear conjunctiva, MMM LUNGS: unlabored respiration  Skin Testing:  Skin prick testing was placed, which includes aeroallergens/foods, histamine control, and saline control.  Verbal consent was obtained prior to placing test.  Patient tolerated procedure well.  Allergy  testing results were read and interpreted by myself, documented by clinical staff. Adequate positive and negative control.  Positive results to:  Results discussed with patient/family.  Airborne Adult Perc - 11/15/23 1352     Time Antigen Placed 1352    Allergen Manufacturer Jestine    Location Back    Number of Test 55    1. Control-Buffer 50% Glycerol Negative    2. Control-Histamine 3+    3. Bahia Negative    4. Bermuda Negative    5. Johnson Negative    6. Kentucky  Blue Negative    7. Meadow Fescue Negative    8. Perennial Rye Negative    9. Timothy Negative    10. Ragweed Mix Negative    11. Cocklebur Negative    12. Plantain,  English Negative    13. Baccharis Negative    14. Dog Fennel Negative    15. Russian Thistle Negative    16. Lamb's Quarters Negative    17. Sheep Sorrell Negative    18. Rough Pigweed Negative    19. Marsh Elder, Rough Negative    20. Mugwort, Common Negative    21. Box, Elder Negative    22. Cedar, red Negative    23. Sweet Gum Negative    24. Pecan  Pollen Negative    25. Pine Mix Negative    26. Walnut, Black Pollen Negative    27. Red Mulberry Negative    28. Ash Mix Negative    29. Birch Mix Negative    30. Beech American Negative    31. Cottonwood, Eastern Negative    32. Hickory, White Negative    33. Maple Mix Negative    34. Oak, Eastern Mix Negative    35. Sycamore Eastern Negative    36. Alternaria Alternata Negative    37. Cladosporium Herbarum Negative    38. Aspergillus Mix Negative    39. Penicillium Mix Negative    40. Bipolaris Sorokiniana (Helminthosporium) Negative    41. Drechslera Spicifera (Curvularia) Negative    42. Mucor Plumbeus Negative    43. Fusarium Moniliforme Negative    44. Aureobasidium Pullulans (pullulara) Negative    45. Rhizopus Oryzae Negative    46. Botrytis Cinera Negative    47. Epicoccum Nigrum Negative    48. Phoma Betae Negative    49. Dust Mite Mix Negative    50. Cat Hair 10,000 BAU/ml Negative    51.  Dog Epithelia Negative    52. Mixed Feathers Negative    53. Horse Epithelia Negative    54. Cockroach, German Negative    55. Tobacco  Leaf Negative             Intradermal - 11/15/23 1430     Time Antigen Placed 1430    Allergen Manufacturer Greer    Location Arm    Number of Test 16    Control Negative    Bahia Negative    Bermuda Negative    Johnson Negative    7 Grass Negative    Ragweed Mix Negative    Weed Mix Negative    Tree Mix Negative    Mold 1 2+    Mold 2 2+    Mold 3 3+    Mold 4 3+    Mite Mix 3+    Cat Negative    Dog Negative    Cockroach 3+              Assessment:   1. Allergic rhinitis caused by mold   2. Allergic rhinitis due to dust mite   3. Allergic rhinitis due to insect     Plan/Recommendations:   Allergic Rhinitis: - Due to turbinate hypertrophy and unresponsive to over the counter meds, will perform skin testing to identify aeroallergen triggers.   - SPT 11/2023: molds, dust mites, cockroach - Avoidance measures  discussed. - Use nasal saline rinses before nose sprays such as with Neilmed Sinus Rinse.  Use distilled water.   - Use Flonase  2 sprays each nostril daily. Aim upward and outward. - Use Xyzal  5mg  daily.  - Use Singulair  10mg  daily.   Stop if there are any mood/behavioral changes. - Consider allergy  shots as long term control of your symptoms by teaching your immune system to be more tolerant of your allergy  triggers  COPD - Followed by PCP. On PRN Albuterol. Previously on Spiriva.    Return in about 2 months (around 01/13/2024).  Arleta Blanch, MD [facility] 

## 2023-11-24 ENCOUNTER — Telehealth: Payer: Self-pay

## 2023-11-24 NOTE — Telephone Encounter (Signed)
Just a FYI:   Patients daughter called acting like her father. I checked to make sure she was on the [facility] before continuing. Kendal Hymen , the patients daughter states we did not send in the patients Xyzal refill @ his visit. I informed her we did send in the Xyzal to the [facility]. She  stated that it was never sent to them and its not showing up on the App. I called the pharmacy myself and was told that it was filled on 11/11/2023 but hasn't been shipped. I was told it is still in "processing" & the rep on the phone was unsure why.   I called the patients daughter back to inform her and she started going off saying that we need to make sure it gets shipped out and all of this doesn't make since. I informed her that, she needs to get on the phone with the pharmacy to see why it hasn't been shipped to them, as that is out of our hands.

## 2024-02-01 NOTE — Patient Instructions (Incomplete)
 Allergic rhinitis Continue allergen avoidance measures directed toward mold, dust mite, and cockroach as listed below Continue montelukast 10 mg once a day for control of allergic rhinitis symptoms Continue Flonase 2 sprays in each nostril once a day if needed for nasal symptoms Consider saline nasal rinses as needed for nasal symptoms. Use this before any medicated nasal sprays for best result Consider allergen immunotherapy if your symptoms are not well-controlled with the treatment plan as listed above  Allergic conjunctivitis Some over the counter eye drops include Pataday one drop in each eye once a day as needed for red, itchy eyes OR Zaditor one drop in each eye twice a day as needed for red itchy eyes. Avoid eye drops that say red eye relief as they may contain medications that dry out your eyes.   Call the clinic if this treatment plan is not working well for you.  Follow up in *** or sooner if needed.   Control of Dust Mite Allergen Dust mites play a major role in allergic asthma and rhinitis. They occur in environments with high humidity wherever human skin is found. Dust mites absorb humidity from the atmosphere (ie, they do not drink) and feed on organic matter (including shed human and animal skin). Dust mites are a microscopic type of insect that you cannot see with the naked eye. High levels of dust mites have been detected from mattresses, pillows, carpets, upholstered furniture, bed covers, clothes, soft toys and any woven material. The principal allergen of the dust mite is found in its feces. A gram of dust may contain 1,000 mites and 250,000 fecal particles. Mite antigen is easily measured in the air during house cleaning activities. Dust mites do not bite and do not cause harm to humans, other than by triggering allergies/asthma.  Ways to decrease your exposure to dust mites in your home:  1. Encase mattresses, box springs and pillows with a mite-impermeable barrier or  cover  2. Wash sheets, blankets and drapes weekly in hot water (130 F) with detergent and dry them in a dryer on the hot setting.  3. Have the room cleaned frequently with a vacuum cleaner and a damp dust-mop. For carpeting or rugs, vacuuming with a vacuum cleaner equipped with a high-efficiency particulate air (HEPA) filter. The dust mite allergic individual should not be in a room which is being cleaned and should wait 1 hour after cleaning before going into the room.  4. Do not sleep on upholstered furniture (eg, couches).  5. If possible removing carpeting, upholstered furniture and drapery from the home is ideal. Horizontal blinds should be eliminated in the rooms where the person spends the most time (bedroom, study, television room). Washable vinyl, roller-type shades are optimal.  6. Remove all non-washable stuffed toys from the bedroom. Wash stuffed toys weekly like sheets and blankets above.  7. Reduce indoor humidity to less than 50%. Inexpensive humidity monitors can be purchased at most hardware stores. Do not use a humidifier as can make the problem worse and are not recommended.  Control of Mold Allergen Mold and fungi can grow on a variety of surfaces provided certain temperature and moisture conditions exist.  Outdoor molds grow on plants, decaying vegetation and soil.  The major outdoor mold, Alternaria and Cladosporium, are found in very high numbers during hot and dry conditions.  Generally, a late Summer - Fall peak is seen for common outdoor fungal spores.  Rain will temporarily lower outdoor mold spore count, but counts rise rapidly  when the rainy period ends.  The most important indoor molds are Aspergillus and Penicillium.  Dark, humid and poorly ventilated basements are ideal sites for mold growth.  The next most common sites of mold growth are the bathroom and the kitchen.  Outdoor [employer] Use air conditioning and keep windows closed Avoid exposure to decaying  vegetation. Avoid leaf raking. Avoid grain handling. Consider wearing a face mask if working in moldy areas.  Indoor Mold Control Maintain humidity below 50%. Clean washable surfaces with 5% bleach solution. Remove sources e.g. Contaminated carpets.  Control of Cockroach Allergen Cockroach allergen has been identified as an important cause of acute attacks of asthma, especially in urban settings.  There are fifty-five species of cockroach that exist in the [place], however only three, the [place], [place] species produce allergen that can affect patients with Asthma.  Allergens can be obtained from fecal particles, egg casings and secretions from cockroaches.    Remove food sources. Reduce access to water. Seal access and entry points. Spray runways with 0.5-1% Diazinon or Chlorpyrifos Blow boric acid power under stoves and refrigerator. Place bait stations (hydramethylnon) at feeding sites.

## 2024-02-01 NOTE — Progress Notes (Signed)
 [address] [place] [address] Dept: [phone]  FOLLOW UP NOTE  Patient ID: Jonathan Wheeler, male    DOB: [birth year]  Age: 73 y.o. MRN: [id number] Date of Office Visit: 02/04/2024  Assessment  Chief Complaint: Follow-up (Is doing better but eyes are itching, burning,watery )  HPI Hermes Wafer is a 73 year old male who presents to the clinic for follow-up visit.  He was last seen in this clinic on 11/15/2023 by Dr. Allena Katz for evaluation of allergic rhinitis and COPD.  His last environmental allergy skin testing was on 11/15/2023 and was positive to mold, dust mite, and cockroach.  He continues to follow-up with his primary care provider for management of COPD  At today's visit, he reports his allergic rhinitis has been much more well controlled with occasional symptoms inclusding nasal congestion and occasional sneezing. He continues montelukast 10 mg once a day, Xyzal 5 mg once a day, and Flonase nasal spray daily. His last environmental allergy skin testing was on 11/16/2023 and was positive to mold, dust mites, and cockroach. He reports that he is currently using dust mite covers on his mattress and pillows.   Allergic conjunctivitis is reported as poorly controlled with symptoms including red and itchy eyes with clear drainage. He is currently using an over the counter allergy eye drop without relief of symptoms.   His current medications are listed in the chart.   Drug Allergies:  No Known Allergies  Physical Exam: BP 120/70   Pulse 94   Temp 98.1 F (36.7 C)   Resp 14   Wt 219 lb (99.3 kg)   SpO2 96%   BMI 35.89 kg/m    Physical Exam Vitals reviewed.  Constitutional:      Appearance: Normal appearance.  HENT:     Head: Normocephalic and atraumatic.     Left Ear: Tympanic membrane normal.     Nose: Nose normal.     Comments: Bilateral nares slightly erythematous with thin clear nasal drainage noted. Pharynx normal. Ears normal. Eyes normal.     Mouth/Throat:     Pharynx: Oropharynx is clear.  Cardiovascular:     Rate and Rhythm: Normal rate and regular rhythm.     Heart sounds: Normal heart sounds. No murmur heard. Pulmonary:     Effort: Pulmonary effort is normal.     Breath sounds: Normal breath sounds.     Comments: Lungs clear to auscultation Musculoskeletal:        General: Normal range of motion.  Skin:    General: Skin is warm and dry.  Neurological:     Mental Status: He is alert and oriented to person, place, and time.  Psychiatric:        Mood and Affect: Mood normal.        Behavior: Behavior normal.        Thought Content: Thought content normal.        Judgment: Judgment normal.    Assessment and Plan: 1. Seasonal and perennial allergic rhinitis   2. Seasonal allergic conjunctivitis     Meds ordered this encounter  Medications   DISCONTD: cromolyn (OPTICROM) 4 % ophthalmic solution    Sig: Place 2 drops into both eyes 4 (four) times daily as needed.    Dispense:  10 mL    Refill:  5   montelukast (SINGULAIR) 10 MG tablet    Sig: Take 1 tablet (10 mg total) by mouth at bedtime.    Dispense:  90 tablet  Refill:  3   levocetirizine (XYZAL) 5 MG tablet    Sig: Take 1 tablet (5 mg total) by mouth every evening.    Dispense:  90 tablet    Refill:  3   cromolyn (OPTICROM) 4 % ophthalmic solution    Sig: Place 2 drops into both eyes 4 (four) times daily as needed.    Dispense:  10 mL    Refill:  5    Patient Instructions  Allergic rhinitis Continue allergen avoidance measures directed toward mold, dust mite, and cockroach as listed below Continue montelukast 10 mg once a day for control of allergic rhinitis symptoms Continue Flonase 2 sprays in each nostril once a day if needed for nasal symptoms Consider saline nasal rinses as needed for nasal symptoms. Use this before any medicated nasal sprays for best result Consider allergen immunotherapy if your symptoms are not well-controlled with the  treatment plan as listed above  Allergic conjunctivitis Begin cromolyn 2 drops in each eye up to 4 times a day if needed for red or itchy eyes Avoid eye drops that say red eye relief as they may contain medications that dry out your eyes.   Call the clinic if this treatment plan is not working well for you.  Follow up in 12 months or sooner if needed.   Return in about 1 year (around 02/03/2025), or if symptoms worsen or fail to improve.    Thank you for the opportunity to care for this patient.  Please do not hesitate to contact me with questions.  Thermon Leyland, FNP [facility]

## 2024-02-04 ENCOUNTER — Encounter: Payer: Self-pay | Admitting: Family Medicine

## 2024-02-04 ENCOUNTER — Ambulatory Visit: Payer: Medicare HMO | Admitting: Family Medicine

## 2024-02-04 VITALS — BP 120/70 | HR 94 | Temp 98.1°F | Resp 14 | Wt 219.0 lb

## 2024-02-04 DIAGNOSIS — H1010 Acute atopic conjunctivitis, unspecified eye: Secondary | ICD-10-CM

## 2024-02-04 DIAGNOSIS — J3089 Other allergic rhinitis: Secondary | ICD-10-CM | POA: Diagnosis not present

## 2024-02-04 DIAGNOSIS — H1013 Acute atopic conjunctivitis, bilateral: Secondary | ICD-10-CM | POA: Diagnosis not present

## 2024-02-04 DIAGNOSIS — J302 Other seasonal allergic rhinitis: Secondary | ICD-10-CM | POA: Diagnosis not present

## 2024-02-04 MED ORDER — MONTELUKAST SODIUM 10 MG PO TABS: 10.0000 mg | ORAL_TABLET | Freq: Every day | ORAL | 3 refills | Status: AC

## 2024-02-04 MED ORDER — LEVOCETIRIZINE DIHYDROCHLORIDE 5 MG PO TABS: 5.0000 mg | ORAL_TABLET | Freq: Every evening | ORAL | 3 refills | Status: AC

## 2024-02-04 MED ORDER — CROMOLYN SODIUM 4 % OP SOLN: 2.0000 [drp] | Freq: Four times a day (QID) | OPHTHALMIC | 5 refills | Status: AC | PRN

## 2024-02-04 MED ORDER — CROMOLYN SODIUM 4 % OP SOLN: 2.0000 [drp] | Freq: Four times a day (QID) | OPHTHALMIC | 5 refills | Status: DC | PRN

## 2024-02-06 ENCOUNTER — Encounter: Payer: Self-pay | Admitting: Family Medicine

## 2024-02-06 DIAGNOSIS — H1010 Acute atopic conjunctivitis, unspecified eye: Secondary | ICD-10-CM | POA: Insufficient documentation

## 2024-02-06 DIAGNOSIS — J302 Other seasonal allergic rhinitis: Secondary | ICD-10-CM | POA: Insufficient documentation

## 2025-02-05 ENCOUNTER — Ambulatory Visit: Admitting: Family Medicine
